# Patient Record
Sex: Female | Born: 1965
Health system: Southern US, Community
[De-identification: ages and names within clinical notes are randomized; demographics above are authoritative.]

## PROBLEM LIST (undated history)

## (undated) DIAGNOSIS — F988 Other specified behavioral and emotional disorders with onset usually occurring in childhood and adolescence: Secondary | ICD-10-CM

## (undated) DIAGNOSIS — B351 Tinea unguium: Secondary | ICD-10-CM

## (undated) DIAGNOSIS — F32A Depression, unspecified: Secondary | ICD-10-CM

## (undated) DIAGNOSIS — K219 Gastro-esophageal reflux disease without esophagitis: Secondary | ICD-10-CM

## (undated) DIAGNOSIS — R109 Unspecified abdominal pain: Secondary | ICD-10-CM

## (undated) DIAGNOSIS — E781 Pure hyperglyceridemia: Secondary | ICD-10-CM

## (undated) DIAGNOSIS — K5090 Crohn's disease, unspecified, without complications: Secondary | ICD-10-CM

## (undated) DIAGNOSIS — R002 Palpitations: Secondary | ICD-10-CM

## (undated) DIAGNOSIS — F329 Major depressive disorder, single episode, unspecified: Secondary | ICD-10-CM

## (undated) DIAGNOSIS — Z8041 Family history of malignant neoplasm of ovary: Secondary | ICD-10-CM

## (undated) DIAGNOSIS — K8020 Calculus of gallbladder without cholecystitis without obstruction: Secondary | ICD-10-CM

## (undated) DIAGNOSIS — R5383 Other fatigue: Secondary | ICD-10-CM

## (undated) HISTORY — PX: COLONOSCOPY: SHX174

## (undated) HISTORY — DX: Calculus of gallbladder without cholecystitis without obstruction: K80.20

## (undated) HISTORY — PX: COLON RESECTION: SHX5231

## (undated) HISTORY — DX: Palpitations: R00.2

## (undated) HISTORY — DX: Family history of malignant neoplasm of ovary: Z80.41

## (undated) HISTORY — DX: Other fatigue: R53.83

## (undated) HISTORY — DX: Tinea unguium: B35.1

## (undated) HISTORY — DX: Major depressive disorder, single episode, unspecified: F32.9

## (undated) HISTORY — DX: Unspecified abdominal pain: R10.9

## (undated) HISTORY — DX: Pure hyperglyceridemia: E78.1

## (undated) HISTORY — DX: Depression, unspecified: F32.A

## (undated) HISTORY — DX: Gastro-esophageal reflux disease without esophagitis: K21.9

## (undated) HISTORY — DX: Other specified behavioral and emotional disorders with onset usually occurring in childhood and adolescence: F98.8

---
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
## 1995-07-12 HISTORY — PX: ESOPHAGOGASTRODUODENOSCOPY: SHX1529

## 1997-11-10 ENCOUNTER — Inpatient Hospital Stay (HOSPITAL_COMMUNITY): Admission: EM | Admit: 1997-11-10 | Discharge: 1997-11-12 | Payer: Self-pay | Admitting: Emergency Medicine

## 1997-11-11 ENCOUNTER — Encounter: Payer: Self-pay | Admitting: Gastroenterology

## 1998-02-10 HISTORY — PX: PARTIAL COLECTOMY: SHX5273

## 1998-06-07 ENCOUNTER — Encounter: Payer: Self-pay | Admitting: Gastroenterology

## 1998-06-07 ENCOUNTER — Ambulatory Visit (HOSPITAL_COMMUNITY): Admission: RE | Admit: 1998-06-07 | Discharge: 1998-06-07 | Payer: Self-pay | Admitting: Gastroenterology

## 1998-07-02 ENCOUNTER — Inpatient Hospital Stay (HOSPITAL_COMMUNITY): Admission: RE | Admit: 1998-07-02 | Discharge: 1998-07-09 | Payer: Self-pay | Admitting: Surgery

## 1998-11-20 ENCOUNTER — Encounter (INDEPENDENT_AMBULATORY_CARE_PROVIDER_SITE_OTHER): Payer: Self-pay | Admitting: *Deleted

## 1998-11-20 ENCOUNTER — Other Ambulatory Visit: Admission: RE | Admit: 1998-11-20 | Discharge: 1998-11-20 | Payer: Self-pay | Admitting: Obstetrics and Gynecology

## 1999-11-26 ENCOUNTER — Inpatient Hospital Stay (HOSPITAL_COMMUNITY): Admission: AD | Admit: 1999-11-26 | Discharge: 1999-11-29 | Payer: Self-pay | Admitting: Obstetrics and Gynecology

## 2001-01-29 ENCOUNTER — Other Ambulatory Visit: Admission: RE | Admit: 2001-01-29 | Discharge: 2001-01-29 | Payer: Self-pay | Admitting: Obstetrics and Gynecology

## 2001-11-21 ENCOUNTER — Inpatient Hospital Stay (HOSPITAL_COMMUNITY): Admission: EM | Admit: 2001-11-21 | Discharge: 2001-11-25 | Payer: Self-pay | Admitting: Emergency Medicine

## 2001-11-21 ENCOUNTER — Encounter: Payer: Self-pay | Admitting: Gastroenterology

## 2001-11-22 ENCOUNTER — Encounter: Payer: Self-pay | Admitting: Gastroenterology

## 2002-05-17 ENCOUNTER — Emergency Department (HOSPITAL_COMMUNITY): Admission: EM | Admit: 2002-05-17 | Discharge: 2002-05-17 | Payer: Self-pay

## 2002-05-19 ENCOUNTER — Encounter: Payer: Self-pay | Admitting: Gastroenterology

## 2002-05-19 ENCOUNTER — Encounter: Admission: RE | Admit: 2002-05-19 | Discharge: 2002-05-19 | Payer: Self-pay | Admitting: Gastroenterology

## 2002-08-08 ENCOUNTER — Other Ambulatory Visit: Admission: RE | Admit: 2002-08-08 | Discharge: 2002-08-08 | Payer: Self-pay | Admitting: Obstetrics and Gynecology

## 2003-02-13 ENCOUNTER — Inpatient Hospital Stay (HOSPITAL_COMMUNITY): Admission: AD | Admit: 2003-02-13 | Discharge: 2003-02-16 | Payer: Self-pay | Admitting: Obstetrics and Gynecology

## 2003-03-21 ENCOUNTER — Other Ambulatory Visit: Admission: RE | Admit: 2003-03-21 | Discharge: 2003-03-21 | Payer: Self-pay | Admitting: Obstetrics and Gynecology

## 2004-09-16 ENCOUNTER — Other Ambulatory Visit: Admission: RE | Admit: 2004-09-16 | Discharge: 2004-09-16 | Payer: Self-pay | Admitting: Obstetrics and Gynecology

## 2009-04-24 ENCOUNTER — Emergency Department (HOSPITAL_COMMUNITY): Admission: EM | Admit: 2009-04-24 | Discharge: 2009-04-24 | Payer: Self-pay | Admitting: Emergency Medicine

## 2010-05-05 LAB — LIPASE, BLOOD: Lipase: 25 U/L (ref 11–59)

## 2010-05-05 LAB — CBC
HCT: 39.8 % (ref 36.0–46.0)
Hemoglobin: 13.5 g/dL (ref 12.0–15.0)
MCHC: 33.8 g/dL (ref 30.0–36.0)
MCV: 85.2 fL (ref 78.0–100.0)
RBC: 4.67 MIL/uL (ref 3.87–5.11)
WBC: 12.8 10*3/uL — ABNORMAL HIGH (ref 4.0–10.5)

## 2010-05-05 LAB — POCT PREGNANCY, URINE: Preg Test, Ur: NEGATIVE

## 2010-05-05 LAB — URINALYSIS, ROUTINE W REFLEX MICROSCOPIC
Bilirubin Urine: NEGATIVE
Glucose, UA: NEGATIVE mg/dL
Hgb urine dipstick: NEGATIVE
Ketones, ur: 40 mg/dL — AB
Nitrite: NEGATIVE
Protein, ur: NEGATIVE mg/dL
Specific Gravity, Urine: 1.036 — ABNORMAL HIGH (ref 1.005–1.030)
Urobilinogen, UA: 0.2 mg/dL (ref 0.0–1.0)
pH: 6 (ref 5.0–8.0)

## 2010-05-05 LAB — COMPREHENSIVE METABOLIC PANEL
BUN: 11 mg/dL (ref 6–23)
CO2: 24 mEq/L (ref 19–32)
Calcium: 8.7 mg/dL (ref 8.4–10.5)
Chloride: 107 mEq/L (ref 96–112)
Creatinine, Ser: 0.77 mg/dL (ref 0.4–1.2)
GFR calc non Af Amer: >60 mL/min (ref >60)
Glucose, Bld: 140 mg/dL — ABNORMAL HIGH (ref 70–99)
Total Bilirubin: 0.4 mg/dL (ref 0.3–1.2)

## 2010-05-05 LAB — DIFFERENTIAL
Basophils Absolute: 0 10*3/uL (ref 0.0–0.1)
Lymphocytes Relative: 8 % — ABNORMAL LOW (ref 12–46)
Lymphs Abs: 1 10*3/uL (ref 0.7–4.0)
Neutrophils Relative %: 88 % — ABNORMAL HIGH (ref 43–77)

## 2010-06-28 NOTE — H&P (Signed)
NAME:  Shelley Sutton, Shelley Sutton                    ACCOUNT NO.:  [id number]   MEDICAL RECORD NO.:  [id number]                   PATIENT TYPE:   LOCATION:                                       FACILITY:   PHYSICIAN:  Duke Salvia. Marcelle Overlie, M.D.            DATE OF BIRTH:   DATE OF ADMISSION:  02/13/2003  DATE OF DISCHARGE:                                HISTORY & PHYSICAL   CHIEF COMPLAINT:  For 2-stage labor induction at term.   HISTORY OF PRESENT ILLNESS:  A 45 year old G6, P3, 0-2-3 at term with a  prior history of macrosomia who presents for 2-stage labor induction. Blood  type A positive, rubella titer is positive.   PAST MEDICAL HISTORY:  History of Crohn's disease for which she underwent  a  small bowel resection in the year 2000.   OBSTETRIC HISTORY:  She delivered in 1990, 1995 and a 9 pound, 0 ounce female  in 2001 vaginally.   ALLERGIES:  None.   PAST MEDICAL HISTORY:  1. Appendectomy.  2. Small bowel resection in 2000.  3. Crohn's disease, her GI doctor is Dr. Matthias Hughs.  4. History of HSV. Her last outbreak was 4 to 5 years ago. She has been on     prophylactic Valtrex daily since 35 weeks.   PHYSICAL EXAMINATION:  VITAL SIGNS:  Temperature  92, blood pressure 110/70.  HEENT:  Unremarkable.  NECK:  Supple without masses.  LUNGS:  Clear.  CARDIOVASCULAR:  Regular rate and rhythm without murmur, rub or gallop.  BREASTS:  Not examined.  ABDOMEN:  Term fundal height. Fetal heart rate 140.  PELVIC:  Cervix was 50% vertex, intact membranes. No evidence of HSV or  other lesions.  EXTREMITIES:  Neurologic examination  unremarkable.   IMPRESSION:  1. Term intrauterine pregnancy.  2. History of herpes simplex virus. No evidence of recurrent disease. She     has been on Valtrex by mouth.  3. History of macrosomia.  4. History of Crohn's disease, stable.  5. Positive group B Strep culture.   PLAN:  2-stage labor induction, IV antibiotics when this patient is in  active  labor.                                               Richard M. Marcelle Overlie, M.D.    RMH/MEDQ  D:  02/11/2003  T:  02/11/2003  Job:  [id number]

## 2010-06-28 NOTE — Discharge Summary (Signed)
NAME:  Shelley Sutton, Shelley Sutton                          ACCOUNT NO.:  [id number]   MEDICAL RECORD NO.:  [id number]                   PATIENT TYPE:  INP   LOCATION:  3027                                 FACILITY:  [facility]   PHYSICIAN:  [name], MD                  DATE OF BIRTH:  1965-04-28   DATE OF ADMISSION:  11/21/2001  DATE OF DISCHARGE:  11/25/2001                                 DISCHARGE SUMMARY   FINAL DIAGNOSES:  1. Crohn's ileitis.  2. Status post previous ileal resection.  3. Abdominal pain, nausea and vomiting, probably related to transient bowel     obstruction, resolved.   CONSULTATIONS:  None.   COMPLICATIONS:  None.   PROCEDURE:  CT scan of abdomen and pelvis.   LABORATORY DATA:  Urine culture negative.  Admission white count 12,600,  hemoglobin 13.6, platelets 272,000; 83 polys, 13 lymphs.  INR normal.  Admission metabolic panel normal except for glucose of 130 (nonfasting).  Albumin 3.7.  Liver chemistries normal.  Urinalysis positive for ketones.  Amylase and lipase normal.   HOSPITAL COURSE:  The patient was admitted to the hospital following a  roughly 24-hour prodrome of severe, rather diffuse mid and lower abdominal  pain and protracted nausea and vomiting, ultimately resulting in just dry  heaves.  She presented to the emergency room, where she was admitted by my  covering partner, Dr. Tasia Catchings, with the presumptive diagnosis of an  acute small-bowel obstruction, perhaps from adhesions.  Her admitting  abdominal films showed some mild distention compatible with an early small-  bowel obstruction or ileus.  Due to the rather acute onset of symptoms, it  was felt most likely that the patient's clinical presentation was not  related to a flare-up of her Crohn's disease, but rather due to some  transient mechanical disturbance.  Note that the patient has a history of  Crohn's ileitis, status post a resection several years ago, but has been  maintained  off medication for the past couple of years and had been doing  fine.   The patient was admitted to the floor and treated with bowel rest, IV fluids  and IV pain medications and antiemetics and had a prompt turnaround in  symptoms.  A CT scan the day after admission showed no evidence of  persistent bowel obstruction, but a fair amount of ileal inflammation with  peri-intestinal stranding, bowel wall thickening and so forth.  There was no  evidence of obstruction, perforation or abscess.   The patient was treated with intravenous steroids, based on the above  findings.  Antibiotics were not administered.  Her diet was advanced and  this was well-tolerated, such that by the day of discharge, the patient was  tolerating a solid, low-residue diet without abdominal pain, nausea or  vomiting.  She was having a slight sleep disturbance, possibly related to  the prednisone, as  she had previously experienced as an outpatient some  years ago when she was on prednisone at that time.   DISPOSITION:  The patient is discharged home on a low-residue diet with  instructions to resume normal light activity as tolerated and to call us in  the event of worsened symptoms.  Cautioned regarding possible steroid side-  effects including sleep disturbance, reduced resistance to infection,  aseptic necrosis of the hip and long-term issues which hopefully will not be  relevant here, such as osteoporosis and cataracts.  She will follow up with  me in the office in one week, at which time we will probably set up a  colonoscopy and use that to guide the decision whether or not the patient  should be on maintenance therapy, for example, with 6-MP, which she used  previously and was well-tolerated.   DISCHARGE MEDICATIONS:  1. Prednisone 40 mg daily.  2. May also use a daily multivitamin if desired.   CONDITION ON DISCHARGE:  Improved.                                                [name], MD     RB/MEDQ  D:  11/25/2001  T:  11/27/2001  Job:  [id number]   cc:   Meredith Staggers, M.D.

## 2010-06-28 NOTE — H&P (Signed)
NAME:  Shelley, Sutton NO.:  [id number]   MEDICAL RECORD NO.:  [id number]                   PATIENT TYPE:  EMS   LOCATION:  MAJO                                 FACILITY:  [facility]   PHYSICIAN:  Tasia Catchings, M.D.                DATE OF BIRTH:  15-Aug-1965   DATE OF ADMISSION:  11/21/2001  DATE OF DISCHARGE:                                HISTORY & PHYSICAL   HISTORY OF PRESENT ILLNESS:  Shelley Sutton is a very nice 45 year old female  with a history of Crohn's disease. She has had resection of her terminal  ileum in 2000 and has been essentially asymptomatic ever since. She was  apparently suffering from intermittent abdominal pain, nausea, and vomiting  prior to her resection. Postoperatively, she has been on no medications.   About six weeks ago, she had a spontaneous abortion. She was about [redacted] weeks  pregnant. Since then, she has had the onset of her menstrual period  regularly starting about three days prior to admission. Twelve hours prior  to admission, she had some mild periumbilical crampy pain, but on the  morning of admission felt fine. She had a normal breakfast. Starting at 11  a.m., she began experiencing increasing crampy pain in the periumbilical  area associated with nausea and vomiting. This has persisted for the past  six hours. Normally, she has between four and six stools a day, and since  the onset of this pain, she has had no bowel movements at all.   There has been no extra intestinal manifestations of inflammatory bowel  disease. She has not had chills, fever, mouth sores, joint pains, skin  rashes, or any other symptoms.   PAST MEDICAL HISTORY:   ALLERGIES:  None.   HABITS:  Smoking none. Alcohol none.   CURRENT MEDICATIONS:  None.   PAST SURGICAL HISTORY:  Tonsillectomy and small-bowel resection.   PREVIOUS INJURIES:  None.   FAMILY HISTORY:  Father health and whereabouts unclear. Mother alive age 53  in good health.  No full siblings. Three children alive and well.   SOCIAL HISTORY:  [place] native, lives in [place] since age 65, is an  Magazine features editor, married twice-the last time since 1999.   PHYSICAL EXAMINATION:  GENERAL:  Pleasant, well-nourished female.  VITAL SIGNS:  Normal  NECK:  Supple.  HEENT:  Negative.  HEART:  Tones normal without extra sounds or murmurs.  CHEST:  Clear to auscultation and percussion.  ABDOMEN:  Slightly obese, minimally tender in the periumbilical area without  masses or organomegaly or bruits.  EXTREMITIES:  Showed no clubbing or edema.  PELVIC/RECTAL EXAM:  Deferred.   LABORATORY DATA:  At the time of this dictation, I did not have her labs or  her x-rays.   IMPRESSION:  1. Partial small-bowel obstruction.  2. History of Crohn's disease status post 8-cm terminal ileal resection in  2003Tasia Catchings, M.D.    JW/MEDQ  D:  11/21/2001  T:  11/23/2001  Job:  [id number]   cc:   Meredith Staggers, M.D.   [name], MD  [address]  [place], [place] [address]  Fax: [phone]

## 2010-09-30 ENCOUNTER — Ambulatory Visit
Admission: RE | Admit: 2010-09-30 | Discharge: 2010-09-30 | Disposition: A | Discharge status: Home / Self Care | Payer: BC Managed Care – PPO | Source: Ambulatory Visit | Attending: Family Medicine | Admitting: Family Medicine

## 2010-09-30 ENCOUNTER — Other Ambulatory Visit: Payer: Self-pay | Admitting: Family Medicine

## 2010-09-30 DIAGNOSIS — R05 Cough: Secondary | ICD-10-CM

## 2010-09-30 DIAGNOSIS — R059 Cough, unspecified: Secondary | ICD-10-CM

## 2012-06-04 ENCOUNTER — Emergency Department (HOSPITAL_COMMUNITY): Payer: BC Managed Care – PPO

## 2012-06-04 ENCOUNTER — Emergency Department (HOSPITAL_COMMUNITY)
Admission: EM | Admit: 2012-06-04 | Discharge: 2012-06-04 | Disposition: A | Discharge status: Home / Self Care | Payer: BC Managed Care – PPO | Attending: Emergency Medicine | Admitting: Emergency Medicine

## 2012-06-04 ENCOUNTER — Encounter (HOSPITAL_COMMUNITY): Payer: Self-pay | Admitting: *Deleted

## 2012-06-04 DIAGNOSIS — R1033 Periumbilical pain: Secondary | ICD-10-CM | POA: Insufficient documentation

## 2012-06-04 DIAGNOSIS — Z79899 Other long term (current) drug therapy: Secondary | ICD-10-CM | POA: Insufficient documentation

## 2012-06-04 DIAGNOSIS — R079 Chest pain, unspecified: Secondary | ICD-10-CM | POA: Insufficient documentation

## 2012-06-04 DIAGNOSIS — R109 Unspecified abdominal pain: Secondary | ICD-10-CM

## 2012-06-04 DIAGNOSIS — R112 Nausea with vomiting, unspecified: Secondary | ICD-10-CM | POA: Insufficient documentation

## 2012-06-04 DIAGNOSIS — K5900 Constipation, unspecified: Secondary | ICD-10-CM | POA: Insufficient documentation

## 2012-06-04 DIAGNOSIS — K8020 Calculus of gallbladder without cholecystitis without obstruction: Secondary | ICD-10-CM

## 2012-06-04 DIAGNOSIS — K5090 Crohn's disease, unspecified, without complications: Secondary | ICD-10-CM

## 2012-06-04 HISTORY — DX: Crohn's disease, unspecified, without complications: K50.90

## 2012-06-04 LAB — COMPREHENSIVE METABOLIC PANEL
ALT: 15 U/L (ref 0–35)
Albumin: 3.8 g/dL (ref 3.5–5.2)
Alkaline Phosphatase: 85 U/L (ref 39–117)
BUN: 11 mg/dL (ref 6–23)
Chloride: 97 mEq/L (ref 96–112)
Potassium: 4.2 mEq/L (ref 3.5–5.1)
Sodium: 134 mEq/L — ABNORMAL LOW (ref 135–145)
Total Bilirubin: 0.4 mg/dL (ref 0.3–1.2)
Total Protein: 7.7 g/dL (ref 6.0–8.3)

## 2012-06-04 LAB — CBC WITH DIFFERENTIAL/PLATELET
Basophils Relative: 0 % (ref 0–1)
Eosinophils Absolute: 0.1 10*3/uL (ref 0.0–0.7)
Hemoglobin: 14.1 g/dL (ref 12.0–15.0)
Lymphs Abs: 1.6 10*3/uL (ref 0.7–4.0)
MCH: 27.3 pg (ref 26.0–34.0)
MCHC: 33.7 g/dL (ref 30.0–36.0)
Monocytes Relative: 6 % (ref 3–12)
Neutro Abs: 10 10*3/uL — ABNORMAL HIGH (ref 1.7–7.7)
Neutrophils Relative %: 81 % — ABNORMAL HIGH (ref 43–77)
Platelets: 275 10*3/uL (ref 150–400)
RBC: 5.17 MIL/uL — ABNORMAL HIGH (ref 3.87–5.11)

## 2012-06-04 LAB — URINALYSIS, ROUTINE W REFLEX MICROSCOPIC
Glucose, UA: NEGATIVE mg/dL
Hgb urine dipstick: NEGATIVE
Ketones, ur: 15 mg/dL — AB
Specific Gravity, Urine: 1.038 — ABNORMAL HIGH (ref 1.005–1.030)
pH: 5 (ref 5.0–8.0)

## 2012-06-04 MED ORDER — PREDNISONE 20 MG PO TABS
40.0000 mg | ORAL_TABLET | Freq: Once | ORAL | Status: AC
  Administered 2012-06-04: 40 mg via ORAL
  Filled 2012-06-04: qty 2

## 2012-06-04 MED ORDER — IOHEXOL 300 MG/ML  SOLN
50.0000 mL | Freq: Once | INTRAMUSCULAR | Status: AC | PRN
  Administered 2012-06-04: 50 mL via ORAL

## 2012-06-04 MED ORDER — IOHEXOL 300 MG/ML  SOLN
100.0000 mL | Freq: Once | INTRAMUSCULAR | Status: AC | PRN
  Administered 2012-06-04: 100 mL via INTRAVENOUS

## 2012-06-04 MED ORDER — PREDNISONE 20 MG PO TABS: 40.0000 mg | ORAL_TABLET | Freq: Every day | ORAL | Status: DC

## 2012-06-04 NOTE — ED Notes (Signed)
Pt c/o upper abd pain since 6 am; got better; worse since 2 am; nausea/vomiting

## 2012-06-04 NOTE — ED Notes (Signed)
Pt returned from CT °

## 2012-06-04 NOTE — ED Provider Notes (Signed)
History     CSN: [id number]  Arrival date & time 06/04/12  4098   First MD Initiated Contact with Patient 06/04/12 [phone]      Chief Complaint  Patient presents with  . Abdominal Pain    (Consider location/radiation/quality/duration/timing/severity/associated sxs/prior treatment) Patient is a 47 y.o. female presenting with abdominal pain. The history is provided by the patient. No language interpreter was used.  Abdominal Pain Associated symptoms: chest pain ( mild, yesterday), constipation, nausea and vomiting   Associated symptoms: no chills, no cough, no diarrhea and no fever   Pt is a 47yo female PMH crohns c/o abdominal pain that has occurred 8 other times in the past since surgery in 2000.  States this episode started about 6am yesterday, was 10/10 sharp cramping (contraction like, per patient) pain in periumbilical region that radiated to epigastrium.  Pain eased off until 2am after taking Xanax from mother.  Pt states she just slept through the pain.  Pt has also had some nausea and vomiting with the pain.  Pt had hydrocodone around 2am which did no work initially, but once arrived in ED, pain was gone.  Denies fever or diarrhea.  States she only had a small BM earlier, normally she is very regular.   GI specialist:  Dr. Matthias Hughs   Past Medical History  Diagnosis Date  . Crohn's disease     Past Surgical History  Procedure Laterality Date  . Colon resection      No family history on file.  History  Substance Use Topics  . Smoking status: Never Smoker   . Smokeless tobacco: Not on file  . Alcohol Use: No    OB History   Grav Para Term Preterm Abortions TAB SAB Ect Mult Living                  Review of Systems  Constitutional: Negative for fever and chills.  Respiratory: Negative for cough.   Cardiovascular: Positive for chest pain ( mild, yesterday).  Gastrointestinal: Positive for nausea, vomiting, abdominal pain and constipation. Negative for diarrhea.     Allergies  Review of patient's allergies indicates no known allergies.  Home Medications   Current Outpatient Rx  Name  Route  Sig  Dispense  Refill  . lisdexamfetamine (VYVANSE) 40 MG capsule   Oral   Take 40 mg by mouth daily.         . predniSONE (DELTASONE) 20 MG tablet   Oral   Take 2 tablets (40 mg total) by mouth daily.   10 tablet   0     BP 105/67  Pulse 83  Temp(Src) 97.7 F (36.5 C)  Resp 16  Ht 5' 1.5" (1.562 m)  Wt 150 lb (68.04 kg)  BMI 27.89 kg/m2  SpO2 98%  LMP 05/08/2012  Physical Exam  Constitutional: She appears well-developed and well-nourished. No distress.  Pt resting comfortably on exam bed.  NAD  HENT:  Head: Normocephalic and atraumatic.  Eyes: Conjunctivae are normal. No scleral icterus.  Neck: Normal range of motion. Neck supple.  Cardiovascular: Normal rate, regular rhythm and normal heart sounds.   Pulmonary/Chest: Effort normal and breath sounds normal. No respiratory distress. She has no wheezes.  Abdominal: Soft. Bowel sounds are normal. She exhibits no distension and no mass. There is tenderness ( umbilical and epigastric region). There is no rebound and no guarding.  Musculoskeletal: Normal range of motion.  Neurological: She is alert.  Skin: Skin is warm and dry. She is  not diaphoretic.    ED Course  Procedures (including critical care time)  Labs Reviewed  CBC WITH DIFFERENTIAL - Abnormal; Notable for the following:    WBC 12.4 (*)    RBC 5.17 (*)    Neutrophils Relative 81 (*)    Neutro Abs 10.0 (*)    All other components within normal limits  COMPREHENSIVE METABOLIC PANEL - Abnormal; Notable for the following:    Sodium 134 (*)    Glucose, Bld 137 (*)    All other components within normal limits  URINALYSIS, ROUTINE W REFLEX MICROSCOPIC - Abnormal; Notable for the following:    Color, Urine AMBER (*)    APPearance CLOUDY (*)    Specific Gravity, Urine 1.038 (*)    Bilirubin Urine SMALL (*)    Ketones, ur 15  (*)    All other components within normal limits  LIPASE, BLOOD   Ct Abdomen Pelvis W Contrast  06/04/2012  *RADIOLOGY REPORT*  Clinical Data: Abdominal pain for 2 days.  Nausea.  History of Crohn's disease.  CT ABDOMEN AND PELVIS WITH CONTRAST  Technique:  Multidetector CT imaging of the abdomen and pelvis was performed following the standard protocol during bolus administration of intravenous contrast.  Contrast: 50mL OMNIPAQUE IOHEXOL 300 MG/ML  SOLN, OMNIPAQUE IOHEXOL 300 MG/ML  SOLN  Comparison: 06/04/2012.  Findings: Lung Bases: Clear.  Liver:  Tiny low density lesion in the right hepatic dome compatible with a cyst.  Otherwise normal.  No intrahepatic biliary ductal dilation.  Spleen:  Normal.  Gallbladder:  Calcified gallstone.  Distended gallbladder likely secondary to fasting state.  Common bile duct:  Normal.  Pancreas:  Normal.  Adrenal glands:  Normal.  Kidneys:  Normal enhancement and delayed excretion.  No calculi.  Stomach:  Distended with oral contrast.  No mural thickening or inflammatory changes.  Small bowel:  Nondistended duodenum.  Thickened loops of bowel are present in the upper abdomen.  These thickened jejunal loops suggest enteritis.  Thickening of the bowel wall is greater than expected for under distention.  There is no obstruction.  No free air.    Inflammatory changes are present in the ileum (image number 61 series 2) with mural thickening.  Prominent ileocolic lymph nodes are present. Partial ascending colectomy with ileocolic anastomoses.  The anastomosis appears patent.  Colon:   Fluid levels in the proximal colon likely associated with Crohn's disease. Distal colon appears within normal limits.  Rectum normal.  Pelvic Genitourinary:  Physiologic appearance of the uterus and adnexa.  Small to moderate amount of free fluid, greater than expected for physiologic.  This is probably reactive to enteritis.  Bones:  Ordinary degenerative changes of the sacroiliac joints.  No  erosive changes.  No destructive osseous lesions are identified.  Vasculature: Normal.  IMPRESSION:  1.  Recurrent Crohn's disease affecting the terminal ileum.  No perforation or abscess.  Reactive ileocolic lymph nodes.  Small amount of likely reactive ascites.  More mild inflammation in the proximal jejunum, also likely reflective of Crohn's disease. 2.  Partial ascending colectomy.  Liquid stool in the proximal colon associated with Crohn's disease. 3.  No bowel obstruction. 4.  Calcified gallstone.  No CT evidence of acute cholecystitis.   Original Report Authenticated By: Andreas Newport, M.D.    Dg Abd Acute W/chest  06/04/2012  *RADIOLOGY REPORT*  Clinical Data: Abdominal pain.  Mid abdominal pain with nausea and vomiting.  ACUTE ABDOMEN SERIES (ABDOMEN 2 VIEW & CHEST 1 VIEW)  Comparison:  09/30/2010.  Findings: There is no active cardiopulmonary disease.  No airspace disease.  No effusion.  No free air underneath the hemidiaphragms.  Scattered colonic and small bowel air fluid levels are present. Faint staple line is present in the right mid abdomen.  Calcified gallstone in the right upper quadrant.  There is no small bowel dilation.  Small amount of stool and bowel gas extends to the rectosigmoid.  The overall appearance is nonobstructive.  IMPRESSION: 1.  Scattered nonspecific small bowel and large bowel air fluid levels, most commonly associated with enteric infection. 2.  Cholelithiasis. 3.  Postoperative changes compatible with prior intestinal surgery in the right abdomen.   Original Report Authenticated By: Andreas Newport, M.D.     9:37 AM Pt declined pain medicine at this time.  Will obtain acute abd w/ chest and perform fluid challenge.   1. Abdominal pain   2. Crohn's disease   3. Gall stone       MDM  Pt hx of crohn's had abd pain off and on since surgery in 2000, worse off and on since yesterday associated with n/v/d.  10/10 at worst, about 0/10 right now. Denies fever.  GI  specialist is Dr. Matthias Hughs  UA: neg for infection. CBC: mild leukocytosis.  Acute Abd: scattered nonspecific small & large bowel air fluid levels, cholelithiasis.  CT Abd/pelvis: no acute changes.  Findings consistent with crohn's   Will discharge pt home. Start on 1wk of Prednisone.  Have pt f/u with Dr. Matthias Hughs, GI, in one week.   Vitals: unremarkable. Discharged in stable condition.    Discussed pt with attending during ED encounter.             Junius Finner, PA-C 06/04/12 1028

## 2012-06-04 NOTE — ED Notes (Signed)
Patient transported to CT 

## 2012-06-08 NOTE — ED Provider Notes (Signed)
Medical screening examination/treatment/procedure(s) were performed by non-physician practitioner and as supervising physician I was immediately available for consultation/collaboration.  Raeford Razor, MD 06/08/12 [phone]

## 2014-06-28 ENCOUNTER — Other Ambulatory Visit: Payer: Self-pay | Admitting: Obstetrics and Gynecology

## 2014-06-29 LAB — CYTOLOGY - PAP

## 2014-12-04 ENCOUNTER — Encounter: Payer: Self-pay | Admitting: Cardiovascular Disease

## 2014-12-08 ENCOUNTER — Ambulatory Visit: Payer: Self-pay | Admitting: Cardiovascular Disease

## 2014-12-25 ENCOUNTER — Encounter: Payer: Self-pay | Admitting: Internal Medicine

## 2014-12-25 ENCOUNTER — Ambulatory Visit (INDEPENDENT_AMBULATORY_CARE_PROVIDER_SITE_OTHER): Payer: BLUE CROSS/BLUE SHIELD | Admitting: Internal Medicine

## 2014-12-25 VITALS — BP 118/78 | HR 104 | Ht 61.5 in | Wt 172.8 lb

## 2014-12-25 DIAGNOSIS — R002 Palpitations: Secondary | ICD-10-CM

## 2014-12-25 NOTE — Progress Notes (Signed)
Cardiology Office Note   Date:  12/25/2014   ID:  Shelley Sutton, DOB 10-Mar-1965, MRN [id number]  PCP:  No primary care provider on file.  Cardiologist:   Dietrich Pates, MD   No chief complaint on file.     History of Present Illness: Shelley Sutton is a 49 y.o. female referred for palpitations   She says that she has occasional fluttering in chest  Short lived  No dizziness Not often No syncope Active  No CP Drinks 1 coffee , 1 tea per day Occasional pressure on forehead.  Random  BP up at little at times  130s/90-  Current Outpatient Prescriptions  Medication Sig Dispense Refill  . calcium-vitamin D (OSCAL WITH D) 500-200 MG-UNIT tablet Take 1 tablet by mouth daily with breakfast.    . folic acid (FOLVITE) 1 MG tablet Take 1 mg by mouth daily.    . Nutritional Supplements (JUICE PLUS FIBRE PO) Take 1 tablet by mouth daily.     No current facility-administered medications for this visit.    Allergies:   Dicyclomine hcl   Past Medical History  Diagnosis Date  . Crohn's disease (HCC)   . Esophageal reflux   . Depression   . Onychomycosis   . ADD (attention deficit disorder)   . Hypertriglyceridemia   . Gallstones   . Abdominal pain   . FH: ovarian cancer   . Palpitations   . Fluttering sensation of heart   . Fatigue   . Depression   . Onychomycosis   . Gallstones     Past Surgical History  Procedure Laterality Date  . Colon resection    . Tonsillectomy  1979  . Partial colectomy  2000  . Colonoscopy  09/1995, 06/1998,04/2002  . Esophagogastroduodenoscopy  07/1995     Social History:  The patient  reports that she quit smoking about 18 years ago. She does not have any smokeless tobacco history on file. She reports that she drinks alcohol. She reports that she does not use illicit drugs.   Family History:  The patient's family history includes Brain cancer in her paternal grandfather; Cancer in her paternal grandmother; Heart failure in her maternal  grandfather; Ovarian cancer in her maternal grandmother.    ROS:  Please see the history of present illness. All other systems are reviewed and  Negative to the above problem except as noted.    PHYSICAL EXAM: VS:  BP 118/78 mmHg  Pulse 104  Ht 5' 1.5" (1.562 m)  Wt 78.382 kg (172 lb 12.8 oz)  BMI 32.13 kg/m2  SpO2 97%  GEN: Well nourished, well developed, in no acute distress HEENT: normal Neck: no JVD, carotid bruits, or masses Cardiac: RRR; no murmurs, rubs, or gallops,no edema  Respiratory:  clear to auscultation bilaterally, normal work of breathing GI: soft, nontender, nondistended, + BS  No hepatomegaly  MS: no deformity Moving all extremities   Skin: warm and dry, no rash Neuro:  Strength and sensation are intact Psych: euthymic mood, full affect   EKG:  EKG is not ordered today.  On 8/22 SR 84 bpm    Lipid Panel No results found for: CHOL, TRIG, HDL, CHOLHDL, VLDL, LDLCALC, LDLDIRECT    Wt Readings from Last 3 Encounters:  12/25/14 78.382 kg (172 lb 12.8 oz)  06/04/12 68.04 kg (150 lb)      ASSESSMENT AND PLAN:  1  Palpitations   Occasional  Not hemodynamically destabilizing   Encouraged her to stay hydrated, stay  acitve   HR when I examined her was 84.   I am not convinced of any arrhythmia  2.  HCM  Lipids. Are good  F?U PRN   Signed, Dietrich Pates, MD  12/25/2014 11:11 AM    [facility] [address] [place], [place], [place]  [address] Phone: [phone]; Fax: [phone]

## 2014-12-25 NOTE — Patient Instructions (Signed)
Your physician recommends that you continue on your current medications as directed. Please refer to the Current Medication list given to you today. Your physician recommends that you schedule a follow-up appointment as needed with Dr. Ross.   

## 2015-08-21 DIAGNOSIS — Z1231 Encounter for screening mammogram for malignant neoplasm of breast: Secondary | ICD-10-CM | POA: Diagnosis not present

## 2015-08-21 DIAGNOSIS — Z6831 Body mass index (BMI) 31.0-31.9, adult: Secondary | ICD-10-CM | POA: Diagnosis not present

## 2015-08-21 DIAGNOSIS — Z01419 Encounter for gynecological examination (general) (routine) without abnormal findings: Secondary | ICD-10-CM | POA: Diagnosis not present

## 2015-08-30 DIAGNOSIS — Z131 Encounter for screening for diabetes mellitus: Secondary | ICD-10-CM | POA: Diagnosis not present

## 2015-08-30 DIAGNOSIS — N912 Amenorrhea, unspecified: Secondary | ICD-10-CM | POA: Diagnosis not present

## 2015-08-30 DIAGNOSIS — Z1329 Encounter for screening for other suspected endocrine disorder: Secondary | ICD-10-CM | POA: Diagnosis not present

## 2015-08-30 DIAGNOSIS — Z13220 Encounter for screening for lipoid disorders: Secondary | ICD-10-CM | POA: Diagnosis not present

## 2016-08-25 DIAGNOSIS — Z01419 Encounter for gynecological examination (general) (routine) without abnormal findings: Secondary | ICD-10-CM | POA: Diagnosis not present

## 2016-08-25 DIAGNOSIS — Z1231 Encounter for screening mammogram for malignant neoplasm of breast: Secondary | ICD-10-CM | POA: Diagnosis not present

## 2016-08-25 DIAGNOSIS — Z6832 Body mass index (BMI) 32.0-32.9, adult: Secondary | ICD-10-CM | POA: Diagnosis not present

## 2016-12-02 ENCOUNTER — Other Ambulatory Visit: Payer: Self-pay | Admitting: Family Medicine

## 2016-12-02 ENCOUNTER — Ambulatory Visit
Admission: RE | Admit: 2016-12-02 | Discharge: 2016-12-02 | Disposition: A | Discharge status: Home / Self Care | Payer: BLUE CROSS/BLUE SHIELD | Source: Ambulatory Visit | Attending: Family Medicine | Admitting: Family Medicine

## 2016-12-02 DIAGNOSIS — R5383 Other fatigue: Secondary | ICD-10-CM | POA: Diagnosis not present

## 2016-12-02 DIAGNOSIS — J3489 Other specified disorders of nose and nasal sinuses: Secondary | ICD-10-CM

## 2016-12-02 DIAGNOSIS — B351 Tinea unguium: Secondary | ICD-10-CM | POA: Diagnosis not present

## 2016-12-02 DIAGNOSIS — R51 Headache: Secondary | ICD-10-CM | POA: Diagnosis not present

## 2017-01-09 DIAGNOSIS — K50012 Crohn's disease of small intestine with intestinal obstruction: Secondary | ICD-10-CM | POA: Diagnosis not present

## 2017-01-09 DIAGNOSIS — Z1211 Encounter for screening for malignant neoplasm of colon: Secondary | ICD-10-CM | POA: Diagnosis not present

## 2017-01-09 DIAGNOSIS — Z01818 Encounter for other preprocedural examination: Secondary | ICD-10-CM | POA: Diagnosis not present

## 2017-08-28 DIAGNOSIS — M545 Low back pain: Secondary | ICD-10-CM | POA: Diagnosis not present

## 2017-08-28 DIAGNOSIS — R1030 Lower abdominal pain, unspecified: Secondary | ICD-10-CM | POA: Diagnosis not present

## 2017-09-09 DIAGNOSIS — N8182 Incompetence or weakening of pubocervical tissue: Secondary | ICD-10-CM | POA: Diagnosis not present

## 2017-09-15 DIAGNOSIS — N939 Abnormal uterine and vaginal bleeding, unspecified: Secondary | ICD-10-CM | POA: Diagnosis not present

## 2017-09-15 DIAGNOSIS — N950 Postmenopausal bleeding: Secondary | ICD-10-CM | POA: Diagnosis not present

## 2017-10-13 DIAGNOSIS — Z6827 Body mass index (BMI) 27.0-27.9, adult: Secondary | ICD-10-CM | POA: Diagnosis not present

## 2017-10-13 DIAGNOSIS — Z1231 Encounter for screening mammogram for malignant neoplasm of breast: Secondary | ICD-10-CM | POA: Diagnosis not present

## 2017-10-13 DIAGNOSIS — Z01419 Encounter for gynecological examination (general) (routine) without abnormal findings: Secondary | ICD-10-CM | POA: Diagnosis not present

## 2017-10-13 DIAGNOSIS — Z8041 Family history of malignant neoplasm of ovary: Secondary | ICD-10-CM | POA: Diagnosis not present

## 2017-10-13 DIAGNOSIS — Z808 Family history of malignant neoplasm of other organs or systems: Secondary | ICD-10-CM | POA: Diagnosis not present

## 2017-10-16 DIAGNOSIS — K50012 Crohn's disease of small intestine with intestinal obstruction: Secondary | ICD-10-CM | POA: Diagnosis not present

## 2017-10-16 DIAGNOSIS — Z01818 Encounter for other preprocedural examination: Secondary | ICD-10-CM | POA: Diagnosis not present

## 2017-11-04 ENCOUNTER — Ambulatory Visit (INDEPENDENT_AMBULATORY_CARE_PROVIDER_SITE_OTHER): Payer: BLUE CROSS/BLUE SHIELD | Admitting: Podiatry

## 2017-11-04 ENCOUNTER — Encounter: Payer: Self-pay | Admitting: Podiatry

## 2017-11-04 DIAGNOSIS — Z79899 Other long term (current) drug therapy: Secondary | ICD-10-CM | POA: Diagnosis not present

## 2017-11-04 DIAGNOSIS — B351 Tinea unguium: Secondary | ICD-10-CM

## 2017-11-04 MED ORDER — TERBINAFINE HCL 250 MG PO TABS: 250.0000 mg | ORAL_TABLET | Freq: Every day | ORAL | 0 refills | Status: DC

## 2017-11-04 NOTE — Progress Notes (Signed)
   Subjective: 51 year old female presenting today as a new patient with a chief complaint of possible fungus of the right great toenail that has been present for the past several months. She states the nail is peeling, thickened and discolored. She has not done anything for treatment and denies modifying factors. Patient is here for further evaluation and treatment.   Past Medical History:  Diagnosis Date  . Abdominal pain   . ADD (attention deficit disorder)   . Crohn's disease (HCC)   . Depression   . Depression   . Esophageal reflux   . Fatigue   . FH: ovarian cancer   . Fluttering sensation of heart   . Gallstones   . Gallstones   . Hypertriglyceridemia   . Onychomycosis   . Onychomycosis   . Palpitations     Objective: Physical Exam General: The patient is alert and oriented x3 in no acute distress.  Dermatology: Hyperkeratotic, discolored, thickened, onychodystrophy of the right great toenail. Skin is warm, dry and supple bilateral lower extremities. Negative for open lesions or macerations.  Vascular: Palpable pedal pulses bilaterally. No edema or erythema noted. Capillary refill within normal limits.  Neurological: Epicritic and protective threshold grossly intact bilaterally.   Musculoskeletal Exam: Range of motion within normal limits to all pedal and ankle joints bilateral. Muscle strength 5/5 in all groups bilateral.   Assessment: #1 onychomycosis right great toenail #2 hyperkeratotic nail right hallux   Plan of Care:  #1 Patient was evaluated. #2 Prescription for Lamisil 250 mg #90 provided to patient.  #3 Orders for liver function test placed today.  #4 We will contact patient if LFT is abnormal.  #5 Return to clinic as needed.   Felecia Shelling, DPM [facility]  Dr. Felecia Shelling, DPM    [address]                                        [place], [place] [address]                Office [phone]  Fax [phone]

## 2017-11-20 DIAGNOSIS — Z980 Intestinal bypass and anastomosis status: Secondary | ICD-10-CM | POA: Diagnosis not present

## 2017-11-20 DIAGNOSIS — K5730 Diverticulosis of large intestine without perforation or abscess without bleeding: Secondary | ICD-10-CM | POA: Diagnosis not present

## 2017-11-20 DIAGNOSIS — Z1211 Encounter for screening for malignant neoplasm of colon: Secondary | ICD-10-CM | POA: Diagnosis not present

## 2017-12-22 ENCOUNTER — Other Ambulatory Visit: Payer: Self-pay | Admitting: Gastroenterology

## 2017-12-22 DIAGNOSIS — K50012 Crohn's disease of small intestine with intestinal obstruction: Secondary | ICD-10-CM

## 2017-12-30 ENCOUNTER — Ambulatory Visit
Admission: RE | Admit: 2017-12-30 | Discharge: 2017-12-30 | Disposition: A | Discharge status: Home / Self Care | Payer: BLUE CROSS/BLUE SHIELD | Source: Ambulatory Visit | Attending: Gastroenterology | Admitting: Gastroenterology

## 2017-12-30 DIAGNOSIS — K8020 Calculus of gallbladder without cholecystitis without obstruction: Secondary | ICD-10-CM | POA: Diagnosis not present

## 2017-12-30 DIAGNOSIS — K50012 Crohn's disease of small intestine with intestinal obstruction: Secondary | ICD-10-CM

## 2017-12-30 MED ORDER — IOPAMIDOL (ISOVUE-300) INJECTION 61%
100.0000 mL | Freq: Once | INTRAVENOUS | Status: AC | PRN
  Administered 2017-12-30: 100 mL via INTRAVENOUS

## 2018-01-20 DIAGNOSIS — K50012 Crohn's disease of small intestine with intestinal obstruction: Secondary | ICD-10-CM | POA: Diagnosis not present

## 2018-01-20 DIAGNOSIS — K8020 Calculus of gallbladder without cholecystitis without obstruction: Secondary | ICD-10-CM | POA: Diagnosis not present

## 2019-02-17 DIAGNOSIS — Z6833 Body mass index (BMI) 33.0-33.9, adult: Secondary | ICD-10-CM | POA: Diagnosis not present

## 2019-02-17 DIAGNOSIS — Z01419 Encounter for gynecological examination (general) (routine) without abnormal findings: Secondary | ICD-10-CM | POA: Diagnosis not present

## 2019-02-17 DIAGNOSIS — Z1231 Encounter for screening mammogram for malignant neoplasm of breast: Secondary | ICD-10-CM | POA: Diagnosis not present

## 2019-08-05 DIAGNOSIS — S61207A Unspecified open wound of left little finger without damage to nail, initial encounter: Secondary | ICD-10-CM | POA: Diagnosis not present

## 2019-09-22 DIAGNOSIS — J029 Acute pharyngitis, unspecified: Secondary | ICD-10-CM | POA: Diagnosis not present

## 2019-09-22 DIAGNOSIS — R5383 Other fatigue: Secondary | ICD-10-CM | POA: Diagnosis not present

## 2020-01-10 DIAGNOSIS — Z20822 Contact with and (suspected) exposure to covid-19: Secondary | ICD-10-CM | POA: Diagnosis not present

## 2020-01-10 DIAGNOSIS — U071 COVID-19: Secondary | ICD-10-CM | POA: Diagnosis not present

## 2020-07-08 ENCOUNTER — Emergency Department (HOSPITAL_COMMUNITY): Payer: BC Managed Care – PPO

## 2020-07-08 ENCOUNTER — Encounter (HOSPITAL_COMMUNITY): Payer: Self-pay

## 2020-07-08 ENCOUNTER — Other Ambulatory Visit: Payer: Self-pay

## 2020-07-08 ENCOUNTER — Observation Stay (HOSPITAL_COMMUNITY)
Admission: EM | Admit: 2020-07-08 | Discharge: 2020-07-09 | Disposition: A | Discharge status: Home / Self Care | Payer: BC Managed Care – PPO | Attending: Family Medicine | Admitting: Family Medicine

## 2020-07-08 DIAGNOSIS — R739 Hyperglycemia, unspecified: Secondary | ICD-10-CM | POA: Insufficient documentation

## 2020-07-08 DIAGNOSIS — K5000 Crohn's disease of small intestine without complications: Secondary | ICD-10-CM

## 2020-07-08 DIAGNOSIS — Z87891 Personal history of nicotine dependence: Secondary | ICD-10-CM | POA: Diagnosis not present

## 2020-07-08 DIAGNOSIS — Z20822 Contact with and (suspected) exposure to covid-19: Secondary | ICD-10-CM | POA: Insufficient documentation

## 2020-07-08 DIAGNOSIS — K50919 Crohn's disease, unspecified, with unspecified complications: Secondary | ICD-10-CM

## 2020-07-08 DIAGNOSIS — K7689 Other specified diseases of liver: Secondary | ICD-10-CM | POA: Diagnosis not present

## 2020-07-08 DIAGNOSIS — R109 Unspecified abdominal pain: Secondary | ICD-10-CM | POA: Diagnosis present

## 2020-07-08 DIAGNOSIS — R112 Nausea with vomiting, unspecified: Secondary | ICD-10-CM

## 2020-07-08 DIAGNOSIS — K5090 Crohn's disease, unspecified, without complications: Principal | ICD-10-CM | POA: Insufficient documentation

## 2020-07-08 DIAGNOSIS — R1084 Generalized abdominal pain: Secondary | ICD-10-CM

## 2020-07-08 DIAGNOSIS — K769 Liver disease, unspecified: Secondary | ICD-10-CM

## 2020-07-08 LAB — COMPREHENSIVE METABOLIC PANEL
ALT: 25 U/L (ref 0–44)
AST: 24 U/L (ref 15–41)
Albumin: 4.5 g/dL (ref 3.5–5.0)
Alkaline Phosphatase: 70 U/L (ref 38–126)
Anion gap: 10 (ref 5–15)
BUN: 17 mg/dL (ref 6–20)
CO2: 27 mmol/L (ref 22–32)
Calcium: 10.3 mg/dL (ref 8.9–10.3)
Chloride: 103 mmol/L (ref 98–111)
Creatinine, Ser: 0.79 mg/dL (ref 0.44–1.00)
GFR, Estimated: >60 mL/min (ref >60)
Glucose, Bld: 149 mg/dL — ABNORMAL HIGH (ref 70–99)
Potassium: 3.8 mmol/L (ref 3.5–5.1)
Sodium: 140 mmol/L (ref 135–145)
Total Bilirubin: 0.6 mg/dL (ref 0.3–1.2)
Total Protein: 7.9 g/dL (ref 6.5–8.1)

## 2020-07-08 LAB — CBC WITH DIFFERENTIAL/PLATELET
Abs Immature Granulocytes: 0.04 10*3/uL (ref 0.00–0.07)
Basophils Absolute: 0 10*3/uL (ref 0.0–0.1)
Basophils Relative: 0 %
Eosinophils Absolute: 0 10*3/uL (ref 0.0–0.5)
Eosinophils Relative: 0 %
HCT: 41.5 % (ref 36.0–46.0)
Hemoglobin: 13.9 g/dL (ref 12.0–15.0)
Immature Granulocytes: 0 %
Lymphocytes Relative: 14 %
Lymphs Abs: 1.6 10*3/uL (ref 0.7–4.0)
MCH: 28.7 pg (ref 26.0–34.0)
MCHC: 33.5 g/dL (ref 30.0–36.0)
MCV: 85.7 fL (ref 80.0–100.0)
Monocytes Absolute: 0.6 10*3/uL (ref 0.1–1.0)
Monocytes Relative: 5 %
Neutro Abs: 8.9 10*3/uL — ABNORMAL HIGH (ref 1.7–7.7)
Neutrophils Relative %: 81 %
Platelets: 243 10*3/uL (ref 150–400)
RBC: 4.84 MIL/uL (ref 3.87–5.11)
RDW: 13.1 % (ref 11.5–15.5)
WBC: 11.2 10*3/uL — ABNORMAL HIGH (ref 4.0–10.5)
nRBC: 0 % (ref 0.0–0.2)

## 2020-07-08 LAB — RESP PANEL BY RT-PCR (FLU A&B, COVID) ARPGX2
Influenza A by PCR: NEGATIVE
Influenza B by PCR: NEGATIVE
SARS Coronavirus 2 by RT PCR: NEGATIVE

## 2020-07-08 LAB — LIPASE, BLOOD: Lipase: 37 U/L (ref 11–51)

## 2020-07-08 LAB — HIV ANTIBODY (ROUTINE TESTING W REFLEX): HIV Screen 4th Generation wRfx: NONREACTIVE

## 2020-07-08 MED ORDER — ADULT MULTIVITAMIN W/MINERALS CH
1.0000 | ORAL_TABLET | Freq: Every day | ORAL | Status: DC
  Administered 2020-07-09: 1 via ORAL
  Filled 2020-07-08: qty 1

## 2020-07-08 MED ORDER — MORPHINE SULFATE (PF) 4 MG/ML IV SOLN
4.0000 mg | INTRAVENOUS | Status: DC | PRN
Start: 2020-07-08 — End: 2020-07-09

## 2020-07-08 MED ORDER — ONDANSETRON HCL 4 MG/2ML IJ SOLN
4.0000 mg | Freq: Once | INTRAMUSCULAR | Status: AC
  Administered 2020-07-08: 4 mg via INTRAVENOUS
  Filled 2020-07-08: qty 2

## 2020-07-08 MED ORDER — ACETAMINOPHEN 650 MG RE SUPP: 650.0000 mg | Freq: Four times a day (QID) | RECTAL | Status: DC | PRN

## 2020-07-08 MED ORDER — SODIUM CHLORIDE 0.9 % IV SOLN: Freq: Once | INTRAVENOUS | Status: AC

## 2020-07-08 MED ORDER — OXYMETAZOLINE HCL 0.05 % NA SOLN
1.0000 | Freq: Two times a day (BID) | NASAL | Status: DC | PRN
  Filled 2020-07-08: qty 15

## 2020-07-08 MED ORDER — PREDNISONE 20 MG PO TABS
40.0000 mg | ORAL_TABLET | Freq: Every day | ORAL | Status: DC
  Administered 2020-07-09: 40 mg via ORAL
  Filled 2020-07-08: qty 2

## 2020-07-08 MED ORDER — ACETAMINOPHEN 325 MG PO TABS
650.0000 mg | ORAL_TABLET | Freq: Four times a day (QID) | ORAL | Status: DC | PRN
  Administered 2020-07-09: 650 mg via ORAL
  Filled 2020-07-08: qty 2

## 2020-07-08 MED ORDER — HYDROCODONE-ACETAMINOPHEN 5-325 MG PO TABS: 1.0000 | ORAL_TABLET | ORAL | Status: DC | PRN

## 2020-07-08 MED ORDER — ONDANSETRON HCL 4 MG/2ML IJ SOLN: 4.0000 mg | Freq: Four times a day (QID) | INTRAMUSCULAR | Status: DC | PRN

## 2020-07-08 MED ORDER — PREDNISONE 20 MG PO TABS
40.0000 mg | ORAL_TABLET | Freq: Once | ORAL | Status: AC
  Administered 2020-07-08: 40 mg via ORAL
  Filled 2020-07-08: qty 2

## 2020-07-08 MED ORDER — SODIUM CHLORIDE 0.9 % IV BOLUS
1000.0000 mL | Freq: Once | INTRAVENOUS | Status: AC
  Administered 2020-07-08: 1000 mL via INTRAVENOUS

## 2020-07-08 MED ORDER — FENTANYL CITRATE (PF) 100 MCG/2ML IJ SOLN
100.0000 ug | Freq: Once | INTRAMUSCULAR | Status: AC
  Administered 2020-07-08: 100 ug via INTRAVENOUS
  Filled 2020-07-08: qty 2

## 2020-07-08 MED ORDER — ONDANSETRON HCL 4 MG PO TABS: 4.0000 mg | ORAL_TABLET | Freq: Four times a day (QID) | ORAL | Status: DC | PRN

## 2020-07-08 MED ORDER — OXYCODONE-ACETAMINOPHEN 5-325 MG PO TABS
1.0000 | ORAL_TABLET | Freq: Once | ORAL | Status: AC
  Administered 2020-07-08: 1 via ORAL
  Filled 2020-07-08: qty 1

## 2020-07-08 NOTE — ED Provider Notes (Signed)
[facility]-EMERGENCY DEPT Provider Note   CSN: [id number] Arrival date & time: 07/08/20  8280     History Chief Complaint  Patient presents with  . Abdominal Pain    Shelley Sutton is a 55 y.o. female presenting for evaluation of nausea, vomiting, abd pain.  Symptoms began around 8:00 last night, proximately 12 hours ago.  She had gradual onset upper abdominal pain, nausea, vomiting.  She has been unable to tolerate any p.o. since.  She reports severe constant pain diffusely throughout the abdomen.  She has a history of recurrent bowel obstructions after a partial colectomy due to Crohn's.  Last obstruction was several years ago.  Last bowel movement was last night, however she is unable to have a bowel movement since, this is abnormal for her.  She does not know she is passing gas.  She denies fevers, chest pain, shortness of breath, cough, urinary symptoms.  She is no longer taking anything for her Crohn's, is well controlled after the colectomy.  HPI     Past Medical History:  Diagnosis Date  . Abdominal pain   . ADD (attention deficit disorder)   . Crohn's disease (HCC)   . Depression   . Depression   . Esophageal reflux   . Fatigue   . FH: ovarian cancer   . Fluttering sensation of heart   . Gallstones   . Gallstones   . Hypertriglyceridemia   . Onychomycosis   . Onychomycosis   . Palpitations     Patient Active Problem List   Diagnosis Date Noted  . Crohn's disease (HCC) 07/08/2020    Past Surgical History:  Procedure Laterality Date  . COLON RESECTION    . COLONOSCOPY  09/1995, 06/1998,04/2002  . ESOPHAGOGASTRODUODENOSCOPY  07/1995  . PARTIAL COLECTOMY  2000  . TONSILLECTOMY  1979     OB History   No obstetric history on file.     Family History  Problem Relation Age of Onset  . Ovarian cancer Maternal Grandmother   . Heart failure Maternal Grandfather   . Cancer Paternal Grandmother   . Brain cancer Paternal Grandfather      Social History   Tobacco Use  . Smoking status: Former Smoker    Quit date: 12/03/1996    Years since quitting: 23.6  Substance Use Topics  . Alcohol use: Yes    Alcohol/week: 0.0 standard drinks  . Drug use: No    Home Medications Prior to Admission medications   Medication Sig Start Date End Date Taking? Authorizing Provider  calcium-vitamin D (OSCAL WITH D) 500-200 MG-UNIT tablet Take 1 tablet by mouth daily with breakfast.    [provider]  folic acid (FOLVITE) 1 MG tablet Take 1 mg by mouth daily.    [provider]  Nutritional Supplements (JUICE PLUS FIBRE PO) Take 1 tablet by mouth daily.    [provider]  terbinafine (LAMISIL) 250 MG tablet Take 1 tablet (250 mg total) by mouth daily. 11/04/17   Felecia Shelling, DPM    Allergies    Dicyclomine hcl  Review of Systems   Review of Systems  Gastrointestinal: Positive for abdominal pain, nausea and vomiting.  All other systems reviewed and are negative.   Physical Exam Updated Vital Signs BP 104/73   Pulse 80   Temp (!) 97.5 F (36.4 C) (Oral)   Resp 16   Ht 5\' 1"  (1.549 m)   Wt 68 kg   LMP 05/08/2012   SpO2  98%   BMI 28.34 kg/m   Physical Exam Vitals and nursing note reviewed.  Constitutional:      General: She is not in acute distress.    Appearance: She is well-developed.     Comments: Appears nontoxic  HENT:     Head: Normocephalic and atraumatic.  Eyes:     Conjunctiva/sclera: Conjunctivae normal.     Pupils: Pupils are equal, round, and reactive to light.  Cardiovascular:     Rate and Rhythm: Normal rate and regular rhythm.     Pulses: Normal pulses.  Pulmonary:     Effort: Pulmonary effort is normal. No respiratory distress.     Breath sounds: Normal breath sounds. No wheezing.  Abdominal:     General: There is no distension.     Palpations: Abdomen is soft. There is no mass.     Tenderness: There is generalized abdominal tenderness. There is no guarding or  rebound.     Comments: Diffuse tenderness palpation of the abdomen.  Large surgical scar noted.  No rigidity, guarding, distention.  Hypoactive bowel sounds  Musculoskeletal:        General: Normal range of motion.     Cervical back: Normal range of motion and neck supple.  Skin:    General: Skin is warm and dry.     Capillary Refill: Capillary refill takes less than 2 seconds.  Neurological:     Mental Status: She is alert and oriented to person, place, and time.     ED Results / Procedures / Treatments   Labs (all labs ordered are listed, but only abnormal results are displayed) Labs Reviewed  CBC WITH DIFFERENTIAL/PLATELET - Abnormal; Notable for the following components:      Result Value   WBC 11.2 (*)    Neutro Abs 8.9 (*)    All other components within normal limits  COMPREHENSIVE METABOLIC PANEL - Abnormal; Notable for the following components:   Glucose, Bld 149 (*)    All other components within normal limits  RESP PANEL BY RT-PCR (FLU A&B, COVID) [id number]  LIPASE, BLOOD    EKG None  Radiology CT ABDOMEN PELVIS WO CONTRAST  Result Date: 07/08/2020 CLINICAL DATA:  55 year old female with history of generalized abdominal pain radiating into the back for the past 12 hours with associated nausea. History of bowel obstruction and Crohn's disease. EXAM: CT ABDOMEN AND PELVIS WITHOUT CONTRAST TECHNIQUE: Multidetector CT imaging of the abdomen and pelvis was performed following the standard protocol without IV contrast. COMPARISON:  CT the abdomen and pelvis 12/30/2017. FINDINGS: Lower chest: 2 mm nodule in the periphery of the right lower lobe (axial image 6 of series 4), stable compared to prior study from 2019, considered definitively benign requiring no future imaging follow-up. Hepatobiliary: Ill-defined low-attenuation lesion in segment 4A of the liver (axial image 14 of series 2) incompletely characterized on today's non-contrast CT examination, but new compared to prior CT  12/30/2017. Calcified gallstone lying dependently in the gallbladder measuring 1.5 cm in diameter. Gallbladder is moderately distended, but there is no pericholecystic fluid or surrounding inflammatory changes to indicate an acute diverticulitis at this time. Pancreas: No definite pancreatic mass or peripancreatic fluid collections or inflammatory changes are noted on today's noncontrast CT examination. Spleen: Unremarkable. Adrenals/Urinary Tract: There are no abnormal calcifications within the collecting system of either kidney, along the course of either ureter, or within the lumen of the urinary bladder. No hydroureteronephrosis or perinephric stranding to suggest urinary tract obstruction at this time. The  unenhanced appearance of the kidneys is unremarkable bilaterally. Unenhanced appearance of the urinary bladder is normal. Bilateral adrenal glands are normal in appearance. Stomach/Bowel: Unenhanced appearance of the stomach is normal. No pathologic dilatation of small bowel or colon. A few scattered colonic diverticulae are noted, without surrounding inflammatory changes to indicate an acute diverticulitis at this time. Postoperative changes are noted in the distal small bowel, suggesting prior terminal ileum resection with what appears to be some mild thickening of the neo terminal ileum (poorly evaluated on today's noncontrast CT examination) best appreciated on axial image 46 of series 2. Minimal inflammatory changes are noted adjacent to the neo terminal ileum. Mild diffuse prominence of small-bowel loops without frank dilatation or air-fluid levels to indicate obstruction. Appendix has been resected. Vascular/Lymphatic: No atherosclerotic calcifications in the abdominal aorta or pelvic vasculature. Reproductive: Unenhanced appearance of the uterus and ovaries is unremarkable. Other: No significant volume of ascites.  No pneumoperitoneum. Musculoskeletal: There are no aggressive appearing lytic or  blastic lesions noted in the visualized portions of the skeleton. IMPRESSION: 1. Mural thickening and mild inflammatory changes associated with the neo terminal ileum, which could suggest recurrent active Crohn's disease. At this time, this is not associated with frank bowel obstruction. 2. New but indeterminate intermediate attenuation lesion in segment 4A of the liver. This may simply represent some focal fatty infiltration, however, further characterization with nonemergent outpatient abdominal MRI with and without IV gadolinium is recommended in the near future to better characterize this finding. 3. Cholelithiasis without evidence of acute cholecystitis. 4. Additional incidental findings, as above. Electronically Signed   By: Trudie Reed M.D.   On: 07/08/2020 08:48    Procedures Procedures   Medications Ordered in ED Medications  fentaNYL (SUBLIMAZE) injection 100 mcg (100 mcg Intravenous Given 07/08/20 0800)  ondansetron (ZOFRAN) injection 4 mg (4 mg Intravenous Given 07/08/20 0759)  sodium chloride 0.9 % bolus 1,000 mL (1,000 mLs Intravenous New Bag/Given 07/08/20 0758)  oxyCODONE-acetaminophen (PERCOCET/ROXICET) 5-325 MG per tablet 1 tablet (1 tablet Oral Given 07/08/20 0908)  predniSONE (DELTASONE) tablet 40 mg (40 mg Oral Given 07/08/20 5885)    ED Course  I have reviewed the triage vital signs and the nursing notes.  Pertinent labs & imaging results that were available during my care of the patient were reviewed by me and considered in my medical decision making (see chart for details).    MDM Rules/Calculators/A&P                          Patient presenting for evaluation nausea, vomiting, Donnell pain.  States this feels similar to previous time she has had a bowel obstruction.  On exam, patient appears nontoxic.  She does have diffuse tenderness palpation of the abdomen with hypoactive bowel sounds.  Concern for bowel obstruction.  Also consider constipation.  Consider kidney  stone in the setting of acute onset pain and nausea vomiting.  Consider Crohn's flare.  Will obtain labs, treat symptomatically, and obtain CT abdomen pelvis for further evaluation.  Labs interpreted by me, overall reassuring.  CT abdomen pelvis consistent with acute Crohn's flare, but no signs of obstruction.  There is also an incidental finding of liver lesion.  Discussed this finding with patient, importance of outpatient follow-up.  On reevaluation, patient continues to have discomfort, although slightly improved.  No nausea or vomiting.  Will consult with GI.  Discussed with Dr. Matthias Hughs from [facility] who recommended prednisone taper, pain control, clear  liquid diet x24 hrs with progression as tolerated.  Recommended patient be offered choice for discharge vs 24-hour observation.  Discussed findings and plan with patient.  On reevaluation, patient continues to have significant discomfort.  She does not feel she is ready to go home. Will call for admission.   Discussed with Dr. Ronaldo Miyamoto from [facility], patient to be admitted  Final Clinical Impression(s) / ED Diagnoses Final diagnoses:  Acute Crohn's disease with complication (HCC)  Non-intractable vomiting with nausea, unspecified vomiting type  Diffuse abdominal pain  Liver lesion    Rx / DC Orders ED Discharge Orders    None       Alveria Apley, PA-C 07/08/20 1009    Rolan Bucco, MD 07/08/20 1232

## 2020-07-08 NOTE — ED Notes (Signed)
Ice chips provided and patient tolerated well.

## 2020-07-08 NOTE — H&P (Signed)
History and Physical    Shelley Sutton HWE:[id number] DOB: July 20, 1965 DOA: 07/08/2020  PCP: System, Provider Not In  Patient coming from: Home  Chief Complaint: abdominal pain, N/V  HPI: Shelley Sutton is a 55 y.o. female with medical history significant of crohns. Presenting with abdominal pain. Her symptoms started last night around 1930hrs. It was worst in the umbilical region, but moved globally. It was crampy pain that came in waves. She had associated bloating as well. She tried hydrocodone, and it provided some temporary relief. However, she woke this morning with N/V. She decided to come to the ED. She denies any other aggravating or alleviating factors.      ED Course: CT ab/pelvis showed an area of inflammation at terminal ileum. [facility] was consulted. [facility] was called for admission.   Review of Systems:  Denies CP, dyspnea, HA, palpitations, fever. Reports normal BM, abdominal pain. Review of systems is otherwise negative for all not mentioned in HPI.   PMHx Past Medical History:  Diagnosis Date  . Abdominal pain   . ADD (attention deficit disorder)   . Crohn's disease (HCC)   . Depression   . Depression   . Esophageal reflux   . Fatigue   . FH: ovarian cancer   . Fluttering sensation of heart   . Gallstones   . Gallstones   . Hypertriglyceridemia   . Onychomycosis   . Onychomycosis   . Palpitations     PSHx Past Surgical History:  Procedure Laterality Date  . COLON RESECTION    . COLONOSCOPY  09/1995, 06/1998,04/2002  . ESOPHAGOGASTRODUODENOSCOPY  07/1995  . PARTIAL COLECTOMY  2000  . TONSILLECTOMY  1979    SocHx  reports that she quit smoking about 23 years ago. She does not have any smokeless tobacco history on file. She reports current alcohol use. She reports that she does not use drugs.  Allergies  Allergen Reactions  . Dicyclomine Hcl Nausea And Vomiting    FamHx Family History  Problem Relation Age of Onset  . Ovarian cancer Maternal Grandmother    . Heart failure Maternal Grandfather   . Cancer Paternal Grandmother   . Brain cancer Paternal Grandfather     Prior to Admission medications   Medication Sig Start Date End Date Taking? Authorizing Provider  Ascorbic Acid (VITAMIN C PO) Take 1 tablet by mouth daily.   Yes [provider]  B Complex Vitamins (VITAMIN B-COMPLEX PO) Take 1 tablet by mouth daily.   Yes [provider]  Multiple Vitamin (MULTIVITAMIN WITH MINERALS) TABS tablet Take 1 tablet by mouth daily.   Yes [provider]  oxymetazoline (AFRIN) 0.05 % nasal spray Place 1 spray into both nostrils 2 (two) times daily as needed for congestion.   Yes [provider]    Physical Exam: Vitals:   07/08/20 0728 07/08/20 0905  BP: 127/86 104/73  Pulse: 60 80  Resp: 18 16  Temp: (!) 97.5 F (36.4 C)   TempSrc: Oral   SpO2: 100% 98%  Weight: 68 kg   Height: 5\' 1"  (1.549 m)     General: 55 y.o. female resting in bed in NAD Eyes: PERRL, normal sclera ENMT: Nares patent w/o discharge, orophaynx clear, dentition normal, ears w/o discharge/lesions/ulcers Neck: Supple, trachea midline Cardiovascular: RRR, +S1, S2, no m/g/r, equal pulses throughout Respiratory: CTABL, no w/r/r, normal WOB GI: BS+, ND, RLQ/RUQ TTP, no masses noted, no organomegaly noted MSK: No e/c/c Skin: No rashes, bruises, ulcerations noted Neuro: A&O  x 3, no focal deficits Psyc: Appropriate interaction and affect, calm/cooperative  Labs on Admission: I have personally reviewed following labs and imaging studies  CBC: Recent Labs  Lab 07/08/20 0751  WBC 11.2*  NEUTROABS 8.9*  HGB 13.9  HCT 41.5  MCV 85.7  PLT 243   Basic Metabolic Panel: Recent Labs  Lab 07/08/20 0751  NA 140  K 3.8  CL 103  CO2 27  GLUCOSE 149*  BUN 17  CREATININE 0.79  CALCIUM 10.3   GFR: Estimated Creatinine Clearance: 70.9 mL/min (by C-G formula based on SCr of 0.79 mg/dL). Liver Function Tests: Recent Labs  Lab  07/08/20 0751  AST 24  ALT 25  ALKPHOS 70  BILITOT 0.6  PROT 7.9  ALBUMIN 4.5   Recent Labs  Lab 07/08/20 0751  LIPASE 37   No results for input(s): AMMONIA in the last 168 hours. Coagulation Profile: No results for input(s): INR, PROTIME in the last 168 hours. Cardiac Enzymes: No results for input(s): CKTOTAL, CKMB, CKMBINDEX, TROPONINI in the last 168 hours. BNP (last 3 results) No results for input(s): PROBNP in the last 8760 hours. HbA1C: No results for input(s): HGBA1C in the last 72 hours. CBG: No results for input(s): GLUCAP in the last 168 hours. Lipid Profile: No results for input(s): CHOL, HDL, LDLCALC, TRIG, CHOLHDL, LDLDIRECT in the last 72 hours. Thyroid Function Tests: No results for input(s): TSH, T4TOTAL, FREET4, T3FREE, THYROIDAB in the last 72 hours. Anemia Panel: No results for input(s): VITAMINB12, FOLATE, FERRITIN, TIBC, IRON, RETICCTPCT in the last 72 hours. Urine analysis:    Component Value Date/Time   COLORURINE AMBER (A) 06/04/2012 0640   APPEARANCEUR CLOUDY (A) 06/04/2012 0640   LABSPEC 1.038 (H) 06/04/2012 0640   PHURINE 5.0 06/04/2012 0640   GLUCOSEU NEGATIVE 06/04/2012 0640   HGBUR NEGATIVE 06/04/2012 0640   BILIRUBINUR SMALL (A) 06/04/2012 0640   KETONESUR 15 (A) 06/04/2012 0640   PROTEINUR NEGATIVE 06/04/2012 0640   UROBILINOGEN 0.2 06/04/2012 0640   NITRITE NEGATIVE 06/04/2012 0640   LEUKOCYTESUR NEGATIVE 06/04/2012 0640    Radiological Exams on Admission: CT ABDOMEN PELVIS WO CONTRAST  Result Date: 07/08/2020 CLINICAL DATA:  55 year old female with history of generalized abdominal pain radiating into the back for the past 12 hours with associated nausea. History of bowel obstruction and Crohn's disease. EXAM: CT ABDOMEN AND PELVIS WITHOUT CONTRAST TECHNIQUE: Multidetector CT imaging of the abdomen and pelvis was performed following the standard protocol without IV contrast. COMPARISON:  CT the abdomen and pelvis 12/30/2017.  FINDINGS: Lower chest: 2 mm nodule in the periphery of the right lower lobe (axial image 6 of series 4), stable compared to prior study from 2019, considered definitively benign requiring no future imaging follow-up. Hepatobiliary: Ill-defined low-attenuation lesion in segment 4A of the liver (axial image 14 of series 2) incompletely characterized on today's non-contrast CT examination, but new compared to prior CT 12/30/2017. Calcified gallstone lying dependently in the gallbladder measuring 1.5 cm in diameter. Gallbladder is moderately distended, but there is no pericholecystic fluid or surrounding inflammatory changes to indicate an acute diverticulitis at this time. Pancreas: No definite pancreatic mass or peripancreatic fluid collections or inflammatory changes are noted on today's noncontrast CT examination. Spleen: Unremarkable. Adrenals/Urinary Tract: There are no abnormal calcifications within the collecting system of either kidney, along the course of either ureter, or within the lumen of the urinary bladder. No hydroureteronephrosis or perinephric stranding to suggest urinary tract obstruction at this time. The unenhanced appearance of the kidneys  is unremarkable bilaterally. Unenhanced appearance of the urinary bladder is normal. Bilateral adrenal glands are normal in appearance. Stomach/Bowel: Unenhanced appearance of the stomach is normal. No pathologic dilatation of small bowel or colon. A few scattered colonic diverticulae are noted, without surrounding inflammatory changes to indicate an acute diverticulitis at this time. Postoperative changes are noted in the distal small bowel, suggesting prior terminal ileum resection with what appears to be some mild thickening of the neo terminal ileum (poorly evaluated on today's noncontrast CT examination) best appreciated on axial image 46 of series 2. Minimal inflammatory changes are noted adjacent to the neo terminal ileum. Mild diffuse prominence of  small-bowel loops without frank dilatation or air-fluid levels to indicate obstruction. Appendix has been resected. Vascular/Lymphatic: No atherosclerotic calcifications in the abdominal aorta or pelvic vasculature. Reproductive: Unenhanced appearance of the uterus and ovaries is unremarkable. Other: No significant volume of ascites.  No pneumoperitoneum. Musculoskeletal: There are no aggressive appearing lytic or blastic lesions noted in the visualized portions of the skeleton. IMPRESSION: 1. Mural thickening and mild inflammatory changes associated with the neo terminal ileum, which could suggest recurrent active Crohn's disease. At this time, this is not associated with frank bowel obstruction. 2. New but indeterminate intermediate attenuation lesion in segment 4A of the liver. This may simply represent some focal fatty infiltration, however, further characterization with nonemergent outpatient abdominal MRI with and without IV gadolinium is recommended in the near future to better characterize this finding. 3. Cholelithiasis without evidence of acute cholecystitis. 4. Additional incidental findings, as above. Electronically Signed   By: Trudie Reed M.D.   On: 07/08/2020 08:48    EKG: None obtained in ED  Assessment/Plan Crohn's Flare     - place in obs, med-surg     - fluids, steroids, CLD     - pain control, nausea control     - [facility] to see, appreciate assistance  Hyperglycemia     - no history of DM     - check A1c  DVT prophylaxis: SCDs  Code Status: FULL  Family Communication: w/ husband at bed  Consults called: EDP spoke with [facility] (Buccini).   Status is: Observation  The patient remains OBS appropriate and will d/c before 2 midnights.  Dispo: The patient is from: Home              Anticipated d/c is to: Home              Patient currently is not medically stable to d/c.   Difficult to place patient No  Time spent coordinating admission: 40 minutes  Anuar Walgren A Khloei Spiker  DO [facility]  If 7PM-7AM, please contact night-coverage [email or web address]  07/08/2020, 10:44 AM

## 2020-07-08 NOTE — ED Notes (Signed)
Lunch tray given. 

## 2020-07-08 NOTE — ED Triage Notes (Signed)
Generalized abd pain radiating through to back x 12 hrs with Vomiting. Pt states hx of bowel obstruction and chron's disease.

## 2020-07-08 NOTE — Consult Note (Signed)
Referring Provider: [facility] Primary Care Physician:  System, Provider Not In Primary Gastroenterologist:  Dr. Bernette Redbirdobert Tuyen Uncapher  Reason for Consultation: Crohn's disease with abdominal pain  HPI: Shelley Sutton is a 55 y.o. female admitted through the emergency room today because of abdominal pain.    The patient is well-known to me, having been followed by me in the office for the past 25 years for Crohn's ileitis, necessitating a cecal and TI resection (approximately 15 to 20 cm) for stenosis in 2000.   Her course has been very benign since then.  She has been off medications for Crohn's since the year 2004, and I have not seen her in the office for the past 2-1/2 years.  Her last ER visit for obstructive symptoms was in 2019, at which time no obstruction was evident on CT scanning.  Her most recent colonoscopy, October 2019, showed some anastomotic stenosis and friability, followed by CT enterography that did not show any significant inflammation or stricturing.  She does have known gallstones, which have been clinically quiescent.    With that background, the abdominal pain that prompted admission yesterday began around 7:30 PM.  It was diffuse, fairly intense, and culminated in fairly extensive vomiting, particularly after dinner.  It began after eating a large salad with a whole carrot and cauliflower pizza.  There had not been any prodromal abdominal symptoms prior to yesterday evening.  A CT of the abdomen in the emergency room today showed possibly some inflammatory change in the ileal region, nothing dramatic, but no obstruction.  At this time, following pain medication in the emergency room, the patient is feeling much better.   Past Medical History:  Diagnosis Date  . Abdominal pain   . ADD (attention deficit disorder)   . Crohn's disease (HCC)   . Depression   . Depression   . Esophageal reflux   . Fatigue   . FH: ovarian cancer   . Fluttering sensation of heart    . Gallstones   . Gallstones   . Hypertriglyceridemia   . Onychomycosis   . Onychomycosis   . Palpitations     Past Surgical History:  Procedure Laterality Date  . COLON RESECTION    . COLONOSCOPY  09/1995, 06/1998,04/2002  . ESOPHAGOGASTRODUODENOSCOPY  07/1995  . PARTIAL COLECTOMY  2000  . TONSILLECTOMY  1979    Prior to Admission medications   Medication Sig Start Date End Date Taking? Authorizing Provider  Ascorbic Acid (VITAMIN C PO) Take 1 tablet by mouth daily.   Yes [provider]  B Complex Vitamins (VITAMIN B-COMPLEX PO) Take 1 tablet by mouth daily.   Yes [provider]  Multiple Vitamin (MULTIVITAMIN WITH MINERALS) TABS tablet Take 1 tablet by mouth daily.   Yes [provider]  oxymetazoline (AFRIN) 0.05 % nasal spray Place 1 spray into both nostrils 2 (two) times daily as needed for congestion.   Yes [provider]    Current Facility-Administered Medications  Medication Dose Route Frequency Provider Last Rate Last Admin  . 0.9 %  sodium chloride infusion   Intravenous Once Ronaldo MiyamotoKyle, Tyrone A, DO      . acetaminophen (TYLENOL) tablet 650 mg  650 mg Oral Q6H PRN Ronaldo MiyamotoKyle, Tyrone A, DO       Or  . acetaminophen (TYLENOL) suppository 650 mg  650 mg Rectal Q6H PRN Ronaldo MiyamotoKyle, Tyrone A, DO      . HYDROcodone-acetaminophen (NORCO/VICODIN) 5-325 MG per tablet 1-2 tablet  1-2 tablet Oral Q4H  PRN Margie Ege A, DO      . morphine 4 MG/ML injection 4 mg  4 mg Intravenous Q4H PRN Ronaldo Miyamoto, Tyrone A, DO      . multivitamin with minerals tablet 1 tablet  1 tablet Oral Daily Kyle, Tyrone A, DO      . ondansetron (ZOFRAN) tablet 4 mg  4 mg Oral Q6H PRN Ronaldo Miyamoto, Tyrone A, DO       Or  . ondansetron (ZOFRAN) injection 4 mg  4 mg Intravenous Q6H PRN Ronaldo Miyamoto, Tyrone A, DO      . oxymetazoline (AFRIN) 0.05 % nasal spray 1 spray  1 spray Each Nare BID PRN Ronaldo Miyamoto, Tyrone A, DO      . [START ON 07/09/2020] predniSONE (DELTASONE) tablet 40 mg  40 mg Oral Q breakfast Kyle, Tyrone  A, DO        Allergies as of 07/08/2020 - Review Complete 07/08/2020  Allergen Reaction Noted  . Dicyclomine hcl Nausea And Vomiting 12/04/2014    Family History  Problem Relation Age of Onset  . Ovarian cancer Maternal Grandmother   . Heart failure Maternal Grandfather   . Cancer Paternal Grandmother   . Brain cancer Paternal Grandfather     Social History   Socioeconomic History  . Marital status: Married    Spouse name: Not on file  . Number of children: 4  . Years of education: Not on file  . Highest education level: Not on file  Occupational History  . Occupation: EMPLOYED  Tobacco Use  . Smoking status: Former Smoker    Quit date: 12/03/1996    Years since quitting: 23.6  . Smokeless tobacco: Not on file  Substance and Sexual Activity  . Alcohol use: Yes    Alcohol/week: 0.0 standard drinks  . Drug use: No  . Sexual activity: Not on file  Other Topics Concern  . Not on file  Social History Narrative  . Not on file   Social Determinants of Health   Financial Resource Strain: Not on file  Food Insecurity: Not on file  Transportation Needs: Not on file  Physical Activity: Not on file  Stress: Not on file  Social Connections: Not on file  Intimate Partner Violence: Not on file    Review of Systems: See HPI  Physical Exam: Vital signs in last 24 hours: Temp:  [97.5 F (36.4 C)-98.5 F (36.9 C)] 98.5 F (36.9 C) (05/29 1341) Pulse Rate:  [60-95] 76 (05/29 1341) Resp:  [16-20] 18 (05/29 1341) BP: (104-127)/(73-86) 110/78 (05/29 1341) SpO2:  [95 %-100 %] 95 % (05/29 1341) Weight:  [68 kg] 68 kg (05/29 0728) Last BM Date: 07/07/20 General:   Alert,  Well-developed, well-nourished, pleasant and cooperative in NAD Head:  Normocephalic and atraumatic. Eyes:  Sclera clear, no icterus.    Lungs:  Clear throughout to auscultation.   No wheezes, crackles, or rhonchi. No evident respiratory distress. Heart:   Regular rate and rhythm; no murmurs, clicks,  rubs,  or gallops. Abdomen:  Soft, nontender, and nondistended. No masses, hepatosplenomegaly or ventral hernias noted. Quiet bowel sounds (after pain medication), without bruits, guarding, or rebound.   Msk:   Symmetrical without gross deformities. Neurologic:  Alert and coherent;  grossly normal neurologically. Skin:  Intact without significant lesions or rashes. Psych:   Alert and cooperative. Normal mood and affect.  Intake/Output from previous day: No intake/output data recorded. Intake/Output this shift: No intake/output data recorded.  Lab Results: Recent Labs    07/08/20 0751  WBC 11.2*  HGB 13.9  HCT 41.5  PLT 243   BMET Recent Labs    07/08/20 0751  NA 140  K 3.8  CL 103  CO2 27  GLUCOSE 149*  BUN 17  CREATININE 0.79  CALCIUM 10.3   LFT Recent Labs    07/08/20 0751  PROT 7.9  ALBUMIN 4.5  AST 24  ALT 25  ALKPHOS 70  BILITOT 0.6   PT/INR No results for input(s): LABPROT, INR in the last 72 hours.  Studies/Results: CT ABDOMEN PELVIS WO CONTRAST  Result Date: 07/08/2020 CLINICAL DATA:  55 year old female with history of generalized abdominal pain radiating into the back for the past 12 hours with associated nausea. History of bowel obstruction and Crohn's disease. EXAM: CT ABDOMEN AND PELVIS WITHOUT CONTRAST TECHNIQUE: Multidetector CT imaging of the abdomen and pelvis was performed following the standard protocol without IV contrast. COMPARISON:  CT the abdomen and pelvis 12/30/2017. FINDINGS: Lower chest: 2 mm nodule in the periphery of the right lower lobe (axial image 6 of series 4), stable compared to prior study from 2019, considered definitively benign requiring no future imaging follow-up. Hepatobiliary: Ill-defined low-attenuation lesion in segment 4A of the liver (axial image 14 of series 2) incompletely characterized on today's non-contrast CT examination, but new compared to prior CT 12/30/2017. Calcified gallstone lying dependently in the  gallbladder measuring 1.5 cm in diameter. Gallbladder is moderately distended, but there is no pericholecystic fluid or surrounding inflammatory changes to indicate an acute diverticulitis at this time. Pancreas: No definite pancreatic mass or peripancreatic fluid collections or inflammatory changes are noted on today's noncontrast CT examination. Spleen: Unremarkable. Adrenals/Urinary Tract: There are no abnormal calcifications within the collecting system of either kidney, along the course of either ureter, or within the lumen of the urinary bladder. No hydroureteronephrosis or perinephric stranding to suggest urinary tract obstruction at this time. The unenhanced appearance of the kidneys is unremarkable bilaterally. Unenhanced appearance of the urinary bladder is normal. Bilateral adrenal glands are normal in appearance. Stomach/Bowel: Unenhanced appearance of the stomach is normal. No pathologic dilatation of small bowel or colon. A few scattered colonic diverticulae are noted, without surrounding inflammatory changes to indicate an acute diverticulitis at this time. Postoperative changes are noted in the distal small bowel, suggesting prior terminal ileum resection with what appears to be some mild thickening of the neo terminal ileum (poorly evaluated on today's noncontrast CT examination) best appreciated on axial image 46 of series 2. Minimal inflammatory changes are noted adjacent to the neo terminal ileum. Mild diffuse prominence of small-bowel loops without frank dilatation or air-fluid levels to indicate obstruction. Appendix has been resected. Vascular/Lymphatic: No atherosclerotic calcifications in the abdominal aorta or pelvic vasculature. Reproductive: Unenhanced appearance of the uterus and ovaries is unremarkable. Other: No significant volume of ascites.  No pneumoperitoneum. Musculoskeletal: There are no aggressive appearing lytic or blastic lesions noted in the visualized portions of the  skeleton. IMPRESSION: 1. Mural thickening and mild inflammatory changes associated with the neo terminal ileum, which could suggest recurrent active Crohn's disease. At this time, this is not associated with frank bowel obstruction. 2. New but indeterminate intermediate attenuation lesion in segment 4A of the liver. This may simply represent some focal fatty infiltration, however, further characterization with nonemergent outpatient abdominal MRI with and without IV gadolinium is recommended in the near future to better characterize this finding. 3. Cholelithiasis without evidence of acute cholecystitis. 4. Additional incidental findings, as above. Electronically Signed   By: Reuel Boom  Entrikin M.D.   On: 07/08/2020 08:48    Impression: 1.  Abrupt onset of diffuse abdominal pain; I suspect this is due to mechanical obstruction of a slightly stenotic ileocolonic anastomosis, from large amount of vegetable debris consumed prior to the onset of symptoms 2.  History of Crohn's ileitis with possible mild inflammatory changes at present, but no overt bowel obstruction 3.  New spot on the liver on CT scanning, dubious significance, MRI follow-up recommended  Plan: 1.  Agree with clear liquid diet for now.  If feeling fairly well in the morning, I would advance to a low residue diet 2.  Transient prednisone therapy to address any element of swelling due to active inflammation that may be present 3.  Consider follow-up MRI scan to better characterize the hepatic lesion seen as an incidental finding on today's CT   LOS: 0 days   Katy Fitch Jashawn Floyd  07/08/2020, 2:55 PM   Pager [phone] If no answer or after 5 PM call [phone]

## 2020-07-09 DIAGNOSIS — R112 Nausea with vomiting, unspecified: Secondary | ICD-10-CM

## 2020-07-09 DIAGNOSIS — R1084 Generalized abdominal pain: Secondary | ICD-10-CM

## 2020-07-09 LAB — COMPREHENSIVE METABOLIC PANEL
ALT: 19 U/L (ref 0–44)
AST: 18 U/L (ref 15–41)
Albumin: 3.4 g/dL — ABNORMAL LOW (ref 3.5–5.0)
Alkaline Phosphatase: 52 U/L (ref 38–126)
Anion gap: 5 (ref 5–15)
BUN: 9 mg/dL (ref 6–20)
CO2: 27 mmol/L (ref 22–32)
Calcium: 8.9 mg/dL (ref 8.9–10.3)
Chloride: 109 mmol/L (ref 98–111)
Creatinine, Ser: 0.67 mg/dL (ref 0.44–1.00)
GFR, Estimated: >60 mL/min (ref >60)
Glucose, Bld: 103 mg/dL — ABNORMAL HIGH (ref 70–99)
Potassium: 4 mmol/L (ref 3.5–5.1)
Sodium: 141 mmol/L (ref 135–145)
Total Bilirubin: 0.3 mg/dL (ref 0.3–1.2)
Total Protein: 6.3 g/dL — ABNORMAL LOW (ref 6.5–8.1)

## 2020-07-09 LAB — CBC
HCT: 36.5 % (ref 36.0–46.0)
Hemoglobin: 11.8 g/dL — ABNORMAL LOW (ref 12.0–15.0)
MCH: 28.9 pg (ref 26.0–34.0)
MCHC: 32.3 g/dL (ref 30.0–36.0)
MCV: 89.2 fL (ref 80.0–100.0)
Platelets: 217 10*3/uL (ref 150–400)
RBC: 4.09 MIL/uL (ref 3.87–5.11)
RDW: 13.2 % (ref 11.5–15.5)
WBC: 9.6 10*3/uL (ref 4.0–10.5)
nRBC: 0 % (ref 0.0–0.2)

## 2020-07-09 MED ORDER — PREDNISONE 10 MG PO TABS: ORAL_TABLET | ORAL | 0 refills | Status: AC

## 2020-07-09 NOTE — Discharge Summary (Addendum)
Physician Discharge Summary  RAWAN RIENDEAU Sutton:[id number] DOB: 01/24/1966 DOA: 07/08/2020  PCP: Morrell Riddle, PA-C  Admit date: 07/08/2020 Discharge date: 07/09/2020  Recommendations for Outpatient Follow-up:    Modest random hyperglycemia on admission, fasting blood sugar this morning 103, mildly elevated.  Follow-up as an outpatient.    New but indeterminate intermediate attenuation lesion in segment  4A of the liver. This may simply represent some focal fatty infiltration, however, further characterization with nonemergent outpatient abdominal MRI with and without IV gadolinium is recommended in the near future to better characterize this finding.  Per pt Dr. Matthias Hughs GI will follow-up   Follow-up Information    Sutton, Shelley Severin, PA-C Follow up.   Specialty: Physician Assistant Why: keep follow-up schedule Contact information: [address] [place] [address] [phone]        Shelley Redbird, MD Follow up.   Specialty: Gastroenterology Why: as needed. He will set up MRI. Contact information: [address] [place] [address] [phone]                Discharge Diagnoses: Principal diagnosis is #1 Active Problems:   Crohn's disease [name])   Discharge Condition: improved Disposition: home  Diet recommendation:  Diet Orders (From admission, onward)    Start     Ordered   07/09/20 1022  Diet full liquid Room service appropriate? Yes; Fluid consistency: Thin  Diet effective now       Question Answer Comment  Room service appropriate? Yes   Fluid consistency: Thin      07/09/20 1021           Filed Weights   07/08/20 0728  Weight: 68 kg    HPI/Hospital Course:   55 year old woman PMH Crohn's disease presented with abdominal pain.  CT was concerning for acute inflammation terminal ileum.  Admitted for observation, concern for Crohn's flare, GI consultation.  Symptoms resolved rapidly.  Seen by GI with impression  being transient abdominal pain from transient mechanical obstruction and quiescent Crohn's ileitis.  Plan for discharge home on few days of prednisone and outpatient follow-up as needed.  Transient abdominal pain w/ n/v thought secondary to transient mechanical obstruction of slightly stenotic ileocolonic anastomosis from consumption of large amounts of vegetable material.  PMH quiescent Crohn's ileitis without radiographic or clinical evidence to suggest a recent flareup. -- Complete short course of prednisone 30 mg for 2 days then 20 mg for 2 days then 10 mg for 2 days then stop  Modest random hyperglycemia on admission, fasting blood sugar this morning 103, mildly elevated.  Follow-up as an outpatient.   New but indeterminate intermediate attenuation lesion in segment 4A of the liver. This may simply represent some focal fatty infiltration, however, further characterization with nonemergent outpatient abdominal MRI with and without IV gadolinium is recommended in the near future to better characterize this finding.  Consults:  . GI  Today's assessment: S: CC: f/u abd pain  Feels well today, no pain, tolerating liquids. Ready to go home. Wonders if presenting symptoms from shingles vaccine.  O: Vitals:  Vitals:   07/09/20 0616 07/09/20 0921  BP: 95/61 101/66  Pulse: 80 76  Resp: 18 18  Temp: 98.3 F (36.8 C) (!) 97.5 F (36.4 C)  SpO2: 97% 98%    Constitutional:  . Appears calm and comfortable Respiratory:  . CTA bilaterally, no w/r/r.  . Respiratory effort normal. Cardiovascular:  . RRR, no m/r/g . No LE extremity  edema   Abdomen:  . soft Psychiatric:  . Mental status o Mood, affect appropriate   CMP unremarkable WBC WNL Hgb 11.8  Discharge Instructions  Discharge Instructions    Discharge instructions   Complete by: As directed    Call your physician or seek immediate medical attention for pain, vomiting, abdominal pain, fever or worsening of  condition. Start with liquid diet and advance as tolerated.   Increase activity slowly   Complete by: As directed      Allergies as of 07/09/2020      Reactions   Dicyclomine Hcl Nausea And Vomiting      Medication List    TAKE these medications   multivitamin with minerals Tabs tablet Take 1 tablet by mouth daily.   oxymetazoline 0.05 % nasal spray Commonly known as: AFRIN Place 1 spray into both nostrils 2 (two) times daily as needed for congestion.   predniSONE 10 MG tablet Commonly known as: DELTASONE Take 3 tablets (30 mg total) by mouth daily with breakfast for 2 days, THEN 2 tablets (20 mg total) daily with breakfast for 2 days, THEN 1 tablet (10 mg total) daily with breakfast for 2 days. Start taking on: Jul 10, 2020   VITAMIN B-COMPLEX PO Take 1 tablet by mouth daily.   VITAMIN C PO Take 1 tablet by mouth daily.      Allergies  Allergen Reactions  . Dicyclomine Hcl Nausea And Vomiting    The results of significant diagnostics from this hospitalization (including imaging, microbiology, ancillary and laboratory) are listed below for reference.    Significant Diagnostic Studies: CT ABDOMEN PELVIS WO CONTRAST  Result Date: 07/08/2020 CLINICAL DATA:  55 year old female with history of generalized abdominal pain radiating into the back for the past 12 hours with associated nausea. History of bowel obstruction and Crohn's disease. EXAM: CT ABDOMEN AND PELVIS WITHOUT CONTRAST TECHNIQUE: Multidetector CT imaging of the abdomen and pelvis was performed following the standard protocol without IV contrast. COMPARISON:  CT the abdomen and pelvis 12/30/2017. FINDINGS: Lower chest: 2 mm nodule in the periphery of the right lower lobe (axial image 6 of series 4), stable compared to prior study from 2019, considered definitively benign requiring no future imaging follow-up. Hepatobiliary: Ill-defined low-attenuation lesion in segment 4A of the liver (axial image 14 of series 2)  incompletely characterized on today's non-contrast CT examination, but new compared to prior CT 12/30/2017. Calcified gallstone lying dependently in the gallbladder measuring 1.5 cm in diameter. Gallbladder is moderately distended, but there is no pericholecystic fluid or surrounding inflammatory changes to indicate an acute diverticulitis at this time. Pancreas: No definite pancreatic mass or peripancreatic fluid collections or inflammatory changes are noted on today's noncontrast CT examination. Spleen: Unremarkable. Adrenals/Urinary Tract: There are no abnormal calcifications within the collecting system of either kidney, along the course of either ureter, or within the lumen of the urinary bladder. No hydroureteronephrosis or perinephric stranding to suggest urinary tract obstruction at this time. The unenhanced appearance of the kidneys is unremarkable bilaterally. Unenhanced appearance of the urinary bladder is normal. Bilateral adrenal glands are normal in appearance. Stomach/Bowel: Unenhanced appearance of the stomach is normal. No pathologic dilatation of small bowel or colon. A few scattered colonic diverticulae are noted, without surrounding inflammatory changes to indicate an acute diverticulitis at this time. Postoperative changes are noted in the distal small bowel, suggesting prior terminal ileum resection with what appears to be some mild thickening of the neo terminal ileum (poorly evaluated on today's noncontrast  CT examination) best appreciated on axial image 46 of series 2. Minimal inflammatory changes are noted adjacent to the neo terminal ileum. Mild diffuse prominence of small-bowel loops without frank dilatation or air-fluid levels to indicate obstruction. Appendix has been resected. Vascular/Lymphatic: No atherosclerotic calcifications in the abdominal aorta or pelvic vasculature. Reproductive: Unenhanced appearance of the uterus and ovaries is unremarkable. Other: No significant volume of  ascites.  No pneumoperitoneum. Musculoskeletal: There are no aggressive appearing lytic or blastic lesions noted in the visualized portions of the skeleton. IMPRESSION: 1. Mural thickening and mild inflammatory changes associated with the neo terminal ileum, which could suggest recurrent active Crohn's disease. At this time, this is not associated with frank bowel obstruction. 2. New but indeterminate intermediate attenuation lesion in segment 4A of the liver. This may simply represent some focal fatty infiltration, however, further characterization with nonemergent outpatient abdominal MRI with and without IV gadolinium is recommended in the near future to better characterize this finding. 3. Cholelithiasis without evidence of acute cholecystitis. 4. Additional incidental findings, as above. Electronically Signed   By: Trudie Reed M.D.   On: 07/08/2020 08:48    Microbiology: Recent Results (from the past 240 hour(s))  Resp Panel by RT-PCR (Flu A&B, Covid) Nasopharyngeal Swab     Status: None   Collection Time: 07/08/20  9:34 AM   Specimen: Nasopharyngeal Swab; Nasopharyngeal(NP) swabs in vial transport medium  Result Value Ref Range Status   SARS Coronavirus 2 by RT PCR NEGATIVE NEGATIVE Final    Comment: (NOTE) SARS-CoV-2 target nucleic acids are NOT DETECTED.  The SARS-CoV-2 RNA is generally detectable in upper respiratory specimens during the acute phase of infection. The lowest concentration of SARS-CoV-2 viral copies this assay can detect is 138 copies/mL. A negative result does not preclude SARS-Cov-2 infection and should not be used as the sole basis for treatment or other patient management decisions. A negative result may occur with  improper specimen collection/handling, submission of specimen other than nasopharyngeal swab, presence of viral mutation(s) within the areas targeted by this assay, and inadequate number of viral copies(<138 copies/mL). A negative result must be  combined with clinical observations, patient history, and epidemiological information. The expected result is Negative.  Fact Sheet for Patients:  BloggerCourse.com  Fact Sheet for Healthcare Providers:  SeriousBroker.it  This test is no t yet approved or cleared by the [place] FDA and  has been authorized for detection and/or diagnosis of SARS-CoV-2 by FDA under an Emergency Use Authorization (EUA). This EUA will remain  in effect (meaning this test can be used) for the duration of the COVID-19 declaration under Section 564(b)(1) of the Act, 21 U.S.C.section 360bbb-3(b)(1), unless the authorization is terminated  or revoked sooner.       Influenza A by PCR NEGATIVE NEGATIVE Final   Influenza B by PCR NEGATIVE NEGATIVE Final    Comment: (NOTE) The Xpert Xpress SARS-CoV-2/FLU/RSV plus assay is intended as an aid in the diagnosis of influenza from Nasopharyngeal swab specimens and should not be used as a sole basis for treatment. Nasal washings and aspirates are unacceptable for Xpert Xpress SARS-CoV-2/FLU/RSV testing.  Fact Sheet for Patients: BloggerCourse.com  Fact Sheet for Healthcare Providers: SeriousBroker.it  This test is not yet approved or cleared by the [place] FDA and has been authorized for detection and/or diagnosis of SARS-CoV-2 by FDA under an Emergency Use Authorization (EUA). This EUA will remain in effect (meaning this test can be used) for the duration of the COVID-19 declaration under  Section 564(b)(1) of the Act, 21 U.S.C. section 360bbb-3(b)(1), unless the authorization is terminated or revoked.  Performed at [facility], [address], [place], [place] [address]      Labs: Basic Metabolic Panel: Recent Labs  Lab 07/08/20 0751 07/09/20 0448  NA 140 141  K 3.8 4.0  CL 103 109  CO2 27 27  GLUCOSE 149* 103*  BUN  17 9  CREATININE 0.79 0.67  CALCIUM 10.3 8.9   Liver Function Tests: Recent Labs  Lab 07/08/20 0751 07/09/20 0448  AST 24 18  ALT 25 19  ALKPHOS 70 52  BILITOT 0.6 0.3  PROT 7.9 6.3*  ALBUMIN 4.5 3.4*   Recent Labs  Lab 07/08/20 0751  LIPASE 37   CBC: Recent Labs  Lab 07/08/20 0751 07/09/20 0448  WBC 11.2* 9.6  NEUTROABS 8.9*  --   HGB 13.9 11.8*  HCT 41.5 36.5  MCV 85.7 89.2  PLT 243 217    Active Problems:   Crohn's disease (HCC)   Time coordinating discharge: 25 minutes  Signed:  Brendia Sacks, MD  [facility]  07/09/2020, 12:29 PM

## 2020-07-09 NOTE — Progress Notes (Signed)
D/C instructions given to patient. Patient had no questions. NT or writer will wheel patient out once she is dressed  

## 2020-07-09 NOTE — Progress Notes (Signed)
VSS.  No new labs.  Feels well.  No bowel movements yet.  Tolerating clear liquids.  Subjectively, the patient feels ready to go home.  On exam, she is lying in bed in no distress whatsoever.  The abdomen is nondistended, soft, and completely nontender.  Impression:  1.  Transient abdominal pain which I suspect was due to transient mechanical obstruction of a slightly stenotic ileocolonic anastomosis, from consumption of large amounts of vegetable material  2.  History of quiescent Crohn's ileitis, without radiographic or clinical evidence to suggest a recent significant flareup  Recommendation:  1.  Okay for discharge from my standpoint.    2.  I would recommend prednisone 30 mg for 2 days starting tomorrow, then 20 mg for 2 days, then 10 mg for 2 days, and then stop.  (This should cover any element of acute inflammation that might be present, in case this was not a purely "mechanical" obstructive event.)  3.  Office follow-up with me is not required.  However, the patient knows to contact me if she has residual, persistent, or recurrent symptoms that she would like evaluated.  4.  I have started the patient on a full liquid diet.  She can advance to a low residue diet as tolerated.  Florencia Reasons, M.D. Pager [phone] If no answer or after 5 PM call [phone]

## 2020-07-10 LAB — HEMOGLOBIN A1C
Hgb A1c MFr Bld: 5.5 % (ref 4.8–5.6)
Hgb A1c MFr Bld: 5.4 % (ref 4.8–5.6)
Mean Plasma Glucose: 111 mg/dL
Mean Plasma Glucose: 108 mg/dL

## 2020-07-17 ENCOUNTER — Other Ambulatory Visit: Payer: Self-pay | Admitting: Gastroenterology

## 2020-07-17 DIAGNOSIS — R935 Abnormal findings on diagnostic imaging of other abdominal regions, including retroperitoneum: Secondary | ICD-10-CM

## 2020-07-17 DIAGNOSIS — K769 Liver disease, unspecified: Secondary | ICD-10-CM

## 2020-07-18 ENCOUNTER — Other Ambulatory Visit: Payer: Self-pay

## 2020-07-18 ENCOUNTER — Ambulatory Visit (INDEPENDENT_AMBULATORY_CARE_PROVIDER_SITE_OTHER): Payer: BC Managed Care – PPO | Admitting: Podiatry

## 2020-07-18 DIAGNOSIS — M79675 Pain in left toe(s): Secondary | ICD-10-CM

## 2020-07-18 DIAGNOSIS — B351 Tinea unguium: Secondary | ICD-10-CM | POA: Diagnosis not present

## 2020-07-18 DIAGNOSIS — M79674 Pain in right toe(s): Secondary | ICD-10-CM | POA: Diagnosis not present

## 2020-07-18 MED ORDER — TERBINAFINE HCL 250 MG PO TABS: 250.0000 mg | ORAL_TABLET | Freq: Every day | ORAL | 0 refills | Status: AC

## 2020-07-18 NOTE — Progress Notes (Signed)
   Subjective: 55 y.o. female presenting today as a new patient for evaluation of discoloration and thickening to the toenails bilaterally.  Patient states he does have a history of toenail fungus about 10 years ago when she took oral Lamisil which cleared it up.  She recently had blood work done and her liver function enzymes were within normal limits.  She presents for further treatment and evaluation  Past Medical History:  Diagnosis Date  . Abdominal pain   . ADD (attention deficit disorder)   . Crohn's disease (HCC)   . Depression   . Depression   . Esophageal reflux   . Fatigue   . FH: ovarian cancer   . Fluttering sensation of heart   . Gallstones   . Gallstones   . Hypertriglyceridemia   . Onychomycosis   . Onychomycosis   . Palpitations     Objective: Physical Exam General: The patient is alert and oriented x3 in no acute distress.  Dermatology: Hyperkeratotic, discolored, thickened, onychodystrophy noted to digits 1-5 bilateral. Skin is warm, dry and supple bilateral lower extremities. Negative for open lesions or macerations.  Vascular: Palpable pedal pulses bilaterally. No edema or erythema noted. Capillary refill within normal limits.  Neurological: Epicritic and protective threshold grossly intact bilaterally.   Musculoskeletal Exam: Range of motion within normal limits to all pedal and ankle joints bilateral. Muscle strength 5/5 in all groups bilateral.   Assessment: #1 Onychomycosis of toenails 1-5 bilateral   Plan of Care:  #1 Patient was evaluated. #2  Today we discussed different treatment options including oral, topical, and laser antifungal treatment modalities.  The patient opts for oral antifungal treatment modality.  She had recent blood work which was within normal limits.   #3 prescription for Lamisil 250 mg #90 daily #4 return to clinic 6 months   Shelley Sutton, DPM [facility]  Dr. Felecia Sutton, DPM    [address]                                        [place], [place] [address]                Office [phone]  Fax [phone]

## 2020-07-24 ENCOUNTER — Ambulatory Visit
Admission: RE | Admit: 2020-07-24 | Discharge: 2020-07-24 | Disposition: A | Discharge status: Home / Self Care | Payer: BC Managed Care – PPO | Source: Ambulatory Visit | Attending: Gastroenterology | Admitting: Gastroenterology

## 2020-07-24 DIAGNOSIS — R935 Abnormal findings on diagnostic imaging of other abdominal regions, including retroperitoneum: Secondary | ICD-10-CM

## 2020-07-24 DIAGNOSIS — K769 Liver disease, unspecified: Secondary | ICD-10-CM

## 2020-07-24 MED ORDER — GADOBENATE DIMEGLUMINE 529 MG/ML IV SOLN
16.0000 mL | Freq: Once | INTRAVENOUS | Status: AC | PRN
  Administered 2020-07-24: 16 mL via INTRAVENOUS

## 2020-12-21 ENCOUNTER — Other Ambulatory Visit: Payer: Self-pay | Admitting: Gastroenterology

## 2020-12-21 DIAGNOSIS — Z8719 Personal history of other diseases of the digestive system: Secondary | ICD-10-CM

## 2020-12-26 ENCOUNTER — Other Ambulatory Visit: Payer: BC Managed Care – PPO

## 2021-01-15 ENCOUNTER — Ambulatory Visit
Admission: RE | Admit: 2021-01-15 | Discharge: 2021-01-15 | Disposition: A | Discharge status: Home / Self Care | Payer: BC Managed Care – PPO | Source: Ambulatory Visit | Attending: Gastroenterology | Admitting: Gastroenterology

## 2021-01-15 ENCOUNTER — Other Ambulatory Visit: Payer: Self-pay

## 2021-01-15 DIAGNOSIS — Z8719 Personal history of other diseases of the digestive system: Secondary | ICD-10-CM

## 2021-01-15 MED ORDER — IOPAMIDOL (ISOVUE-300) INJECTION 61%
100.0000 mL | Freq: Once | INTRAVENOUS | Status: AC | PRN
  Administered 2021-01-15: 100 mL via INTRAVENOUS

## 2022-01-13 ENCOUNTER — Other Ambulatory Visit: Payer: Self-pay | Admitting: Gastroenterology

## 2022-01-13 DIAGNOSIS — K50019 Crohn's disease of small intestine with unspecified complications: Secondary | ICD-10-CM

## 2022-01-21 ENCOUNTER — Ambulatory Visit
Admission: RE | Admit: 2022-01-21 | Discharge: 2022-01-21 | Disposition: A | Discharge status: Home / Self Care | Payer: BC Managed Care – PPO | Source: Ambulatory Visit | Attending: Gastroenterology | Admitting: Gastroenterology

## 2022-01-21 DIAGNOSIS — K50019 Crohn's disease of small intestine with unspecified complications: Secondary | ICD-10-CM

## 2022-01-21 MED ORDER — IOPAMIDOL (ISOVUE-300) INJECTION 61%
100.0000 mL | Freq: Once | INTRAVENOUS | Status: AC | PRN
  Administered 2022-01-21: 100 mL via INTRAVENOUS

## 2022-02-20 IMAGING — CT CT ABD-PELV W/O CM
2 of 4 series · 15 of 46 positions shown, 17 images · non-contrast
Comparison: CT the abdomen and pelvis 12/30/2017.

CLINICAL DATA: 54-year-old female with history of generalized
abdominal pain radiating into the back for the past 12 hours with
associated nausea. History of bowel obstruction and Crohn's disease.

EXAM:
CT ABDOMEN AND PELVIS WITHOUT CONTRAST
TECHNIQUE: Multidetector CT imaging of the abdomen and pelvis was performed
following the standard protocol without IV contrast.

[Series 2: axial st · axial · 0.70mm/px · z∈[-203,+192]mm · 12 of 91 slices shown, 14 images]
[im 6/91  soft-tissue]
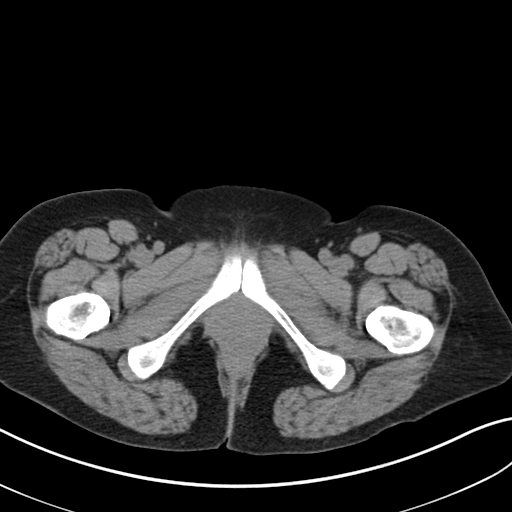
[im 6/91  bone]
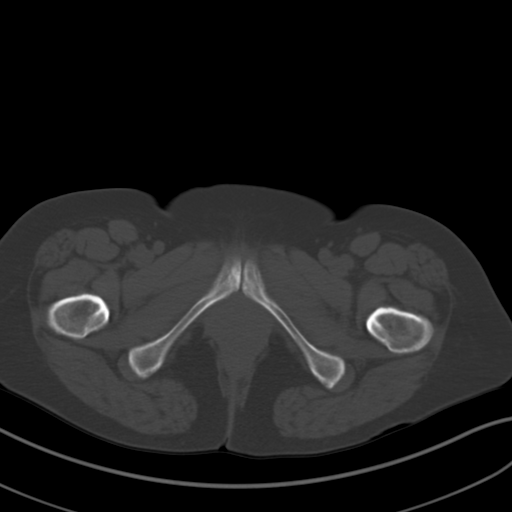
[im 16/91  soft-tissue]
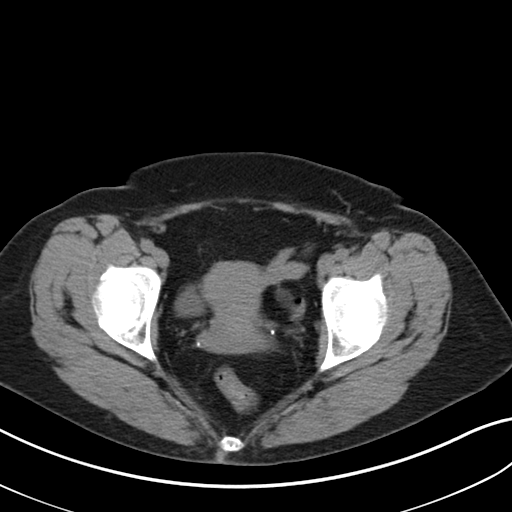
[im 22/91  soft-tissue]
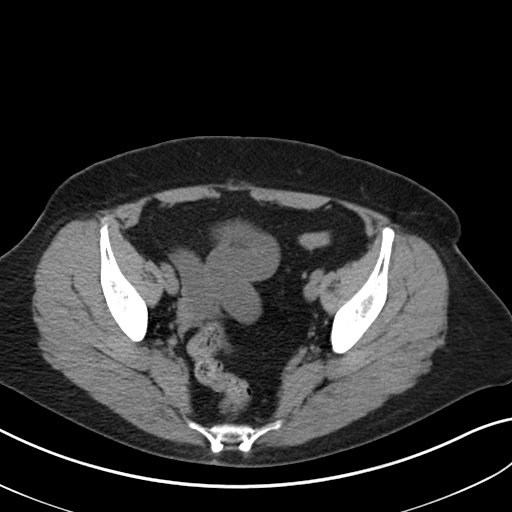
[im 27/91  soft-tissue]
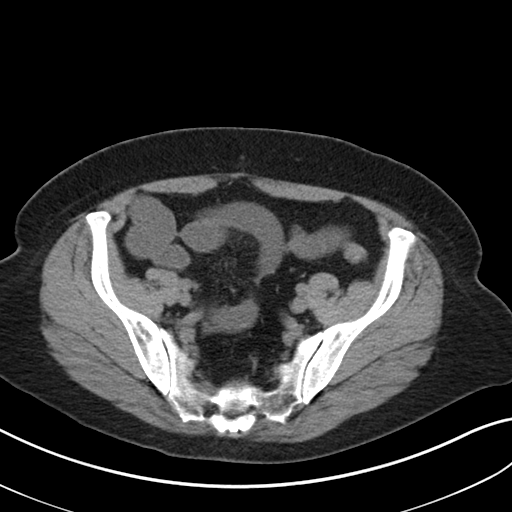
[im 38/91  soft-tissue]
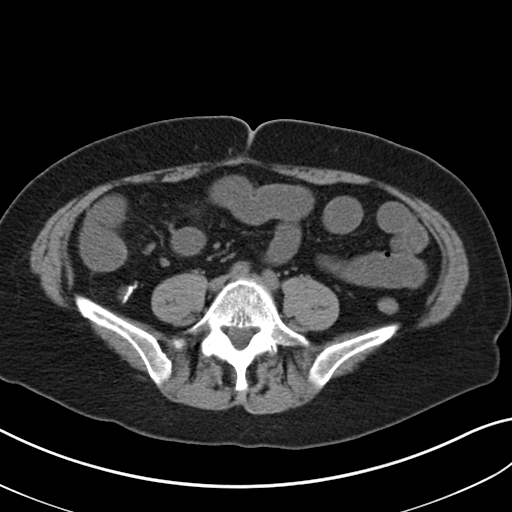
[im 43/91  soft-tissue]
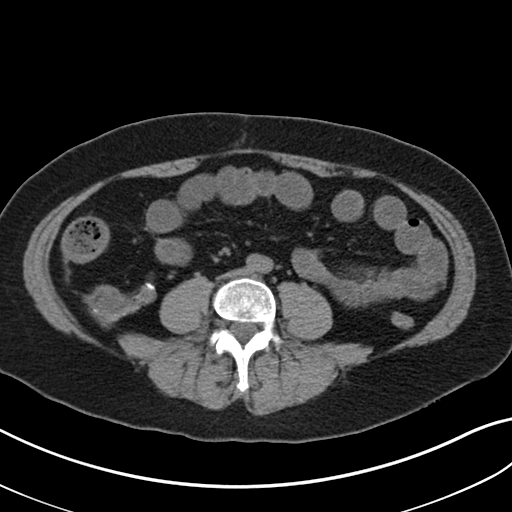
[im 48/91  soft-tissue]
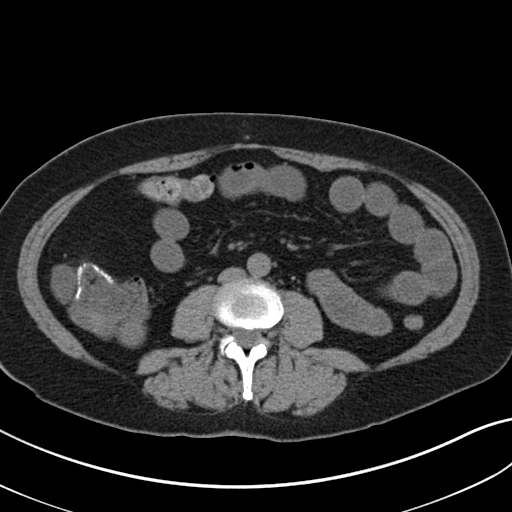
[im 59/91  soft-tissue]
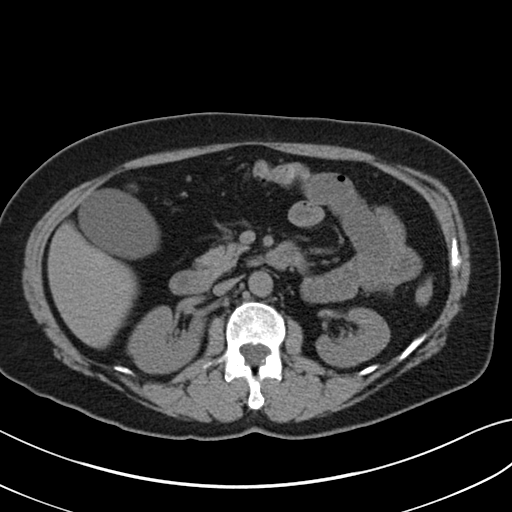
[im 64/91  soft-tissue]
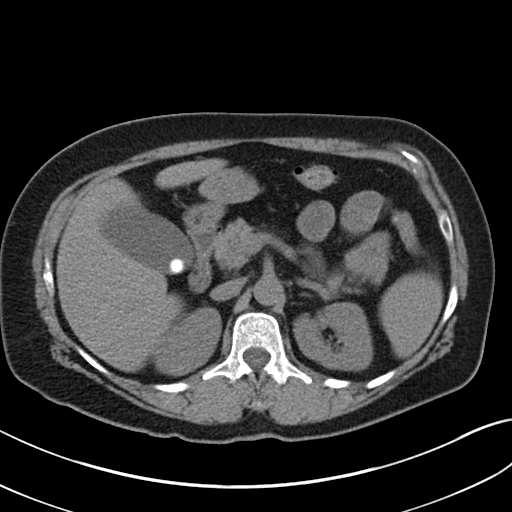
[im 64/91  bone]
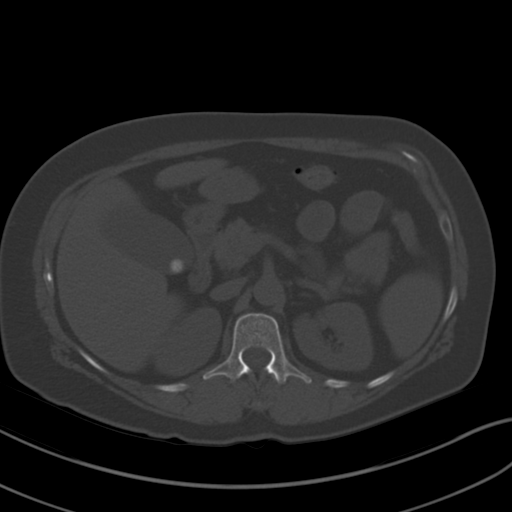
[im 69/91  soft-tissue]
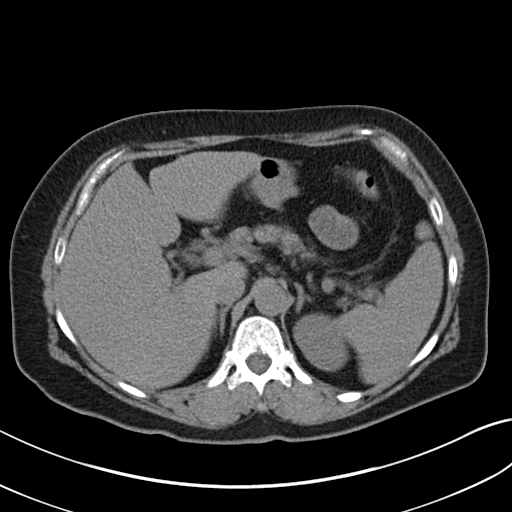
[im 80/91  soft-tissue]
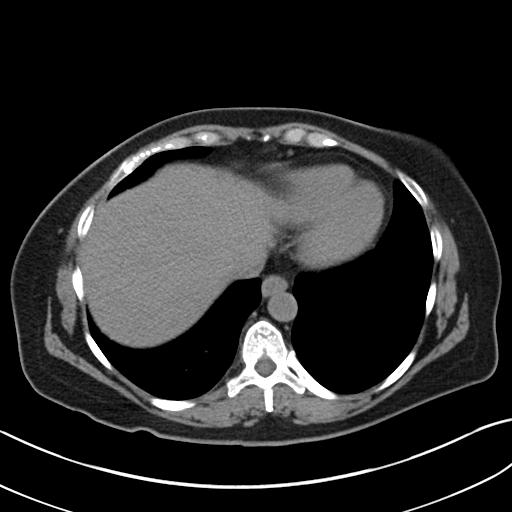
[im 85/91  soft-tissue]
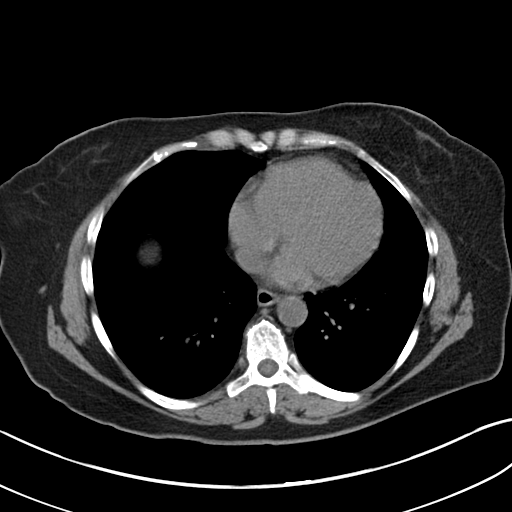

[Series 5: coronal st · coronal · 0.82mm/px · 3 of 135 slices shown]
[im 45/135  soft-tissue]
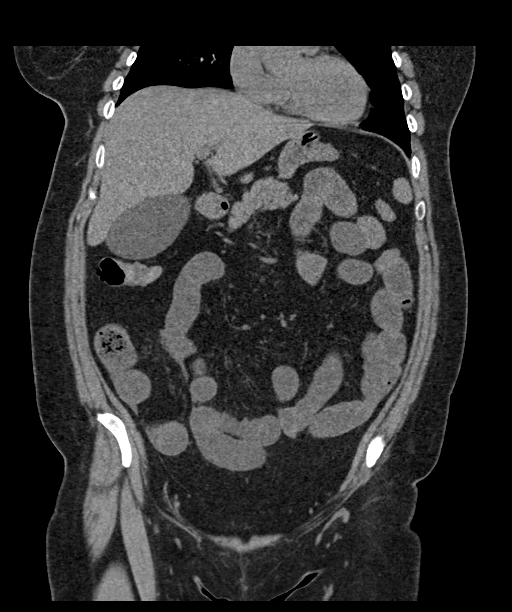
[im 60/135  soft-tissue]
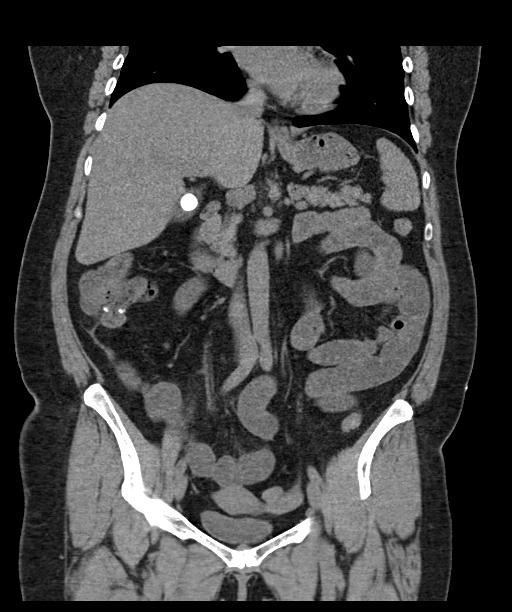
[im 75/135  soft-tissue]
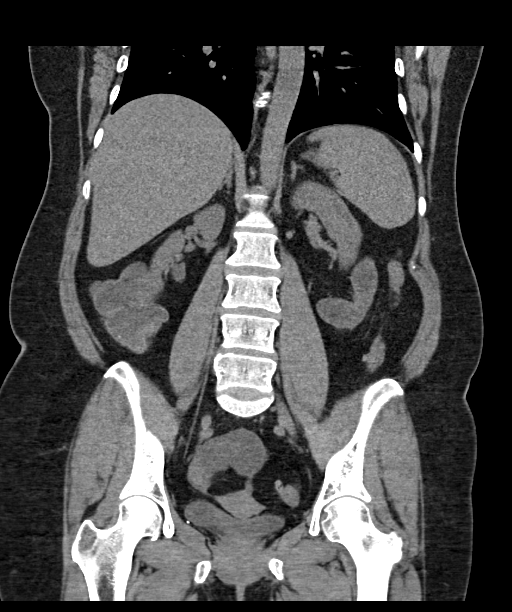

[15 of 46 positions shown; findings below may reference images not displayed]

FINDINGS: Lower chest: 2 mm nodule in the periphery of the right lower lobe
(axial image 6 of series 4), stable compared to prior study from
[id number], considered definitively benign requiring no future imaging
follow-up.

Hepatobiliary: Ill-defined low-attenuation lesion in segment 4A of
the liver (axial image 14 of series 2) incompletely characterized on
today's non-contrast CT examination, but new compared to prior CT
12/30/2017. Calcified gallstone lying dependently in the gallbladder
measuring 1.5 cm in diameter. Gallbladder is moderately distended,
but there is no pericholecystic fluid or surrounding inflammatory
changes to indicate an acute diverticulitis at this time.

Pancreas: No definite pancreatic mass or peripancreatic fluid
collections or inflammatory changes are noted on today's noncontrast
CT examination.

Spleen: Unremarkable.

Adrenals/Urinary Tract: There are no abnormal calcifications within
the collecting system of either kidney, along the course of either
ureter, or within the lumen of the urinary bladder. No
hydroureteronephrosis or perinephric stranding to suggest urinary
tract obstruction at this time. The unenhanced appearance of the
kidneys is unremarkable bilaterally. Unenhanced appearance of the
urinary bladder is normal. Bilateral adrenal glands are normal in
appearance.

Stomach/Bowel: Unenhanced appearance of the stomach is normal. No
pathologic dilatation of small bowel or colon. A few scattered
colonic diverticulae are noted, without surrounding inflammatory
changes to indicate an acute diverticulitis at this time.
Postoperative changes are noted in the distal small bowel,
suggesting prior terminal ileum resection with what appears to be
some mild thickening of the neo terminal ileum (poorly evaluated on
today's noncontrast CT examination) best appreciated on axial image
46 of series 2. Minimal inflammatory changes are noted adjacent to
the neo terminal ileum. Mild diffuse prominence of small-bowel loops
without frank dilatation or air-fluid levels to indicate
obstruction. Appendix has been resected.

Vascular/Lymphatic: No atherosclerotic calcifications in the
abdominal aorta or pelvic vasculature.

Reproductive: Unenhanced appearance of the uterus and ovaries is
unremarkable.

Other: No significant volume of ascites.  No pneumoperitoneum.

Musculoskeletal: There are no aggressive appearing lytic or blastic
lesions noted in the visualized portions of the skeleton.
IMPRESSION: 1. Mural thickening and mild inflammatory changes associated with
the neo terminal ileum, which could suggest recurrent active Crohn's
disease. At this time, this is not associated with frank bowel
obstruction.
2. New but indeterminate intermediate attenuation lesion in segment
4A of the liver. This may simply represent some focal fatty
infiltration, however, further characterization with nonemergent
outpatient abdominal MRI with and without IV gadolinium is
recommended in the near future to better characterize this finding.
3. Cholelithiasis without evidence of acute cholecystitis.
4. Additional incidental findings, as above.

## 2022-07-11 DIAGNOSIS — K760 Fatty (change of) liver, not elsewhere classified: Secondary | ICD-10-CM | POA: Insufficient documentation

## 2022-08-30 IMAGING — CT CT ENTEROGRAPHY (ABD-PELV W/ CM)
1 of 5 series · 13 of 32 positions shown, 18 images · IV contrast (APPLIED)
Comparison: 07/08/2020

CLINICAL DATA: History of Crohn's, status post terminal
ileocolectomy

EXAM:
CT ABDOMEN AND PELVIS WITH CONTRAST (ENTEROGRAPHY)
TECHNIQUE: Multidetector CT of the abdomen and pelvis during bolus
administration of intravenous contrast. Negative oral contrast was
given.
CONTRAST:  100mL [id number] IOPAMIDOL ([id number]-WXX) INJECTION 61%,
negative oral enteric contrast

[Series 5: thins · axial · 0.75mm/px · z∈[-422,-10]mm · 13 of 472 slices shown, 18 images]
[im 30/472  soft-tissue]
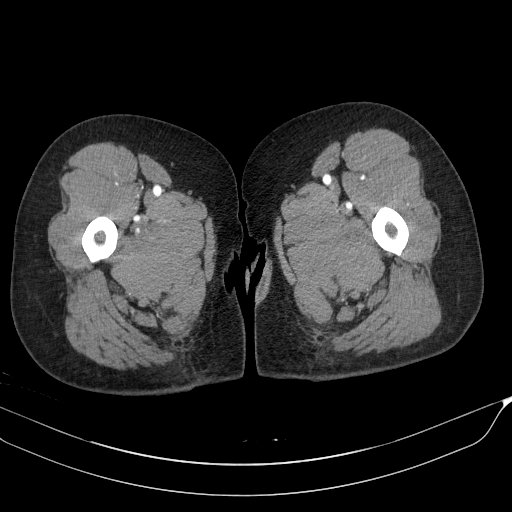
[im 30/472  bone]
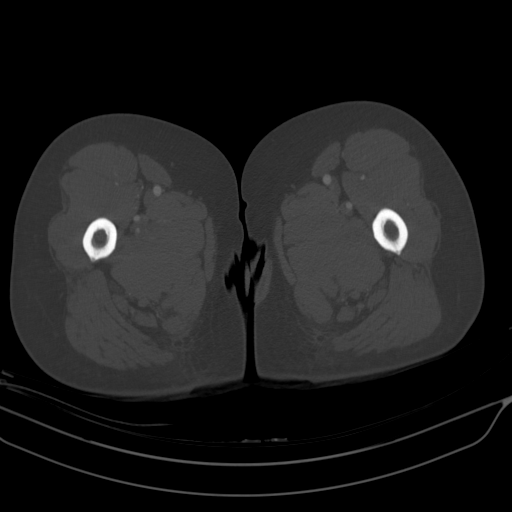
[im 59/472  soft-tissue]
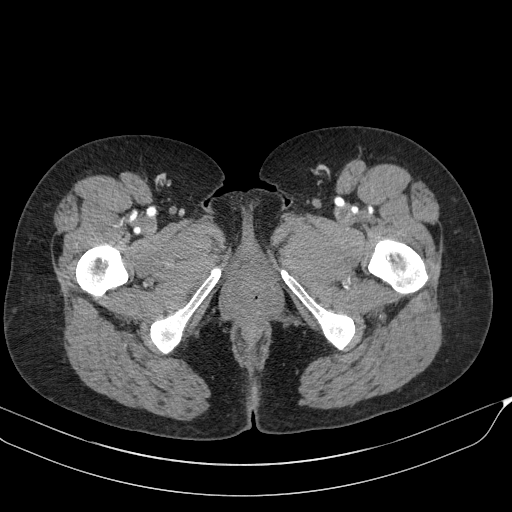
[im 118/472  soft-tissue]
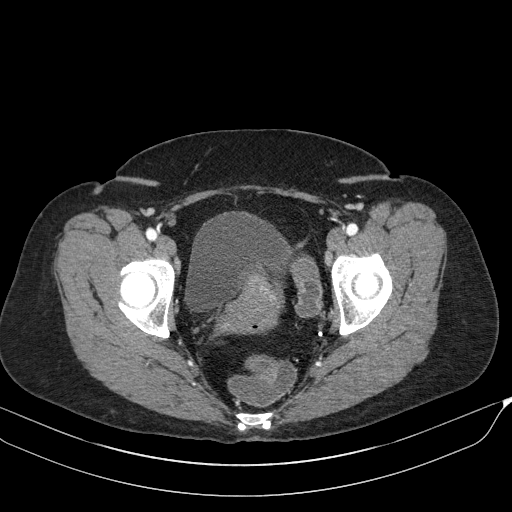
[im 148/472  soft-tissue]
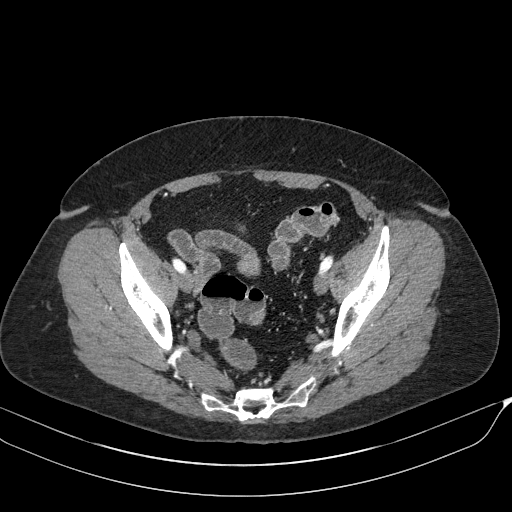
[im 177/472  soft-tissue]
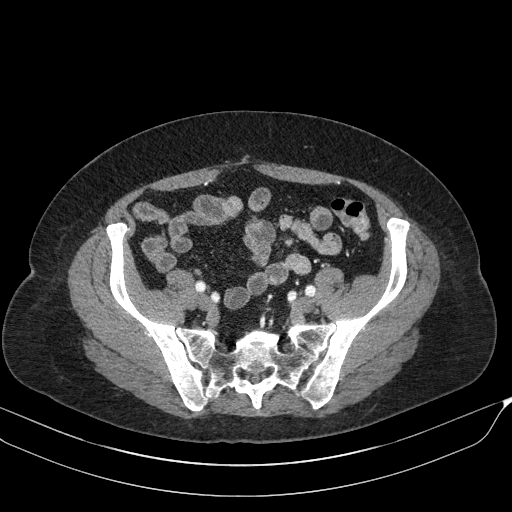
[im 207/472  soft-tissue]
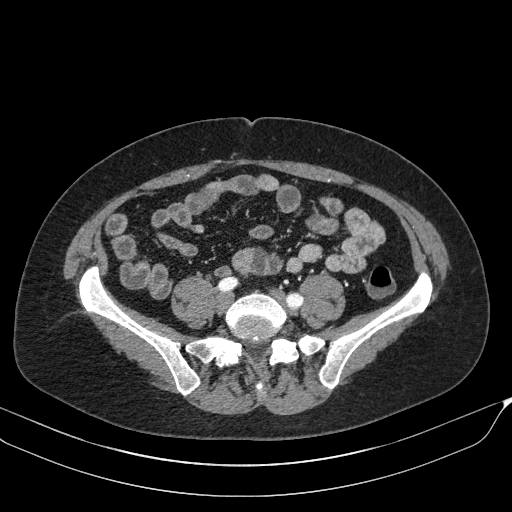
[im 265/472  soft-tissue]
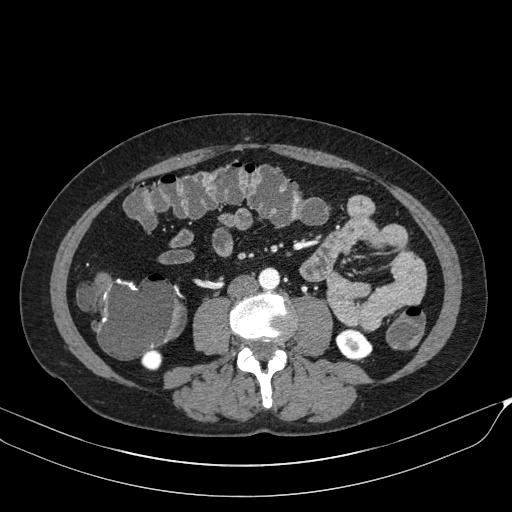
[im 295/472  soft-tissue]
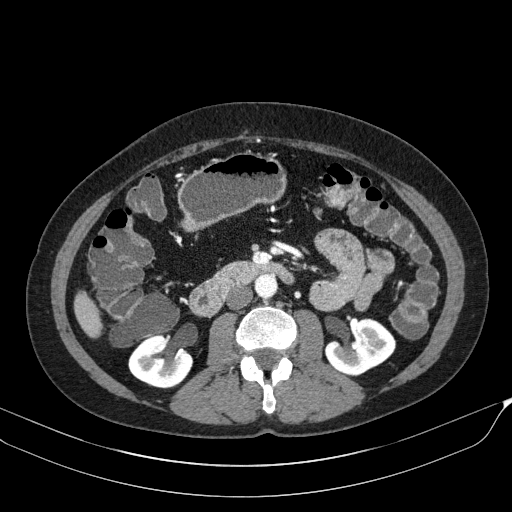
[im 324/472  soft-tissue]
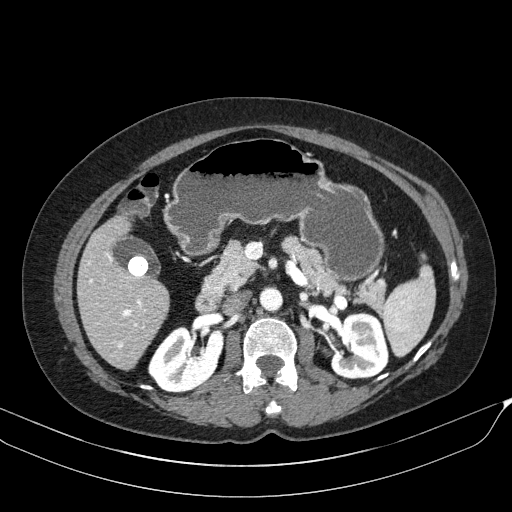
[im 324/472  bone]
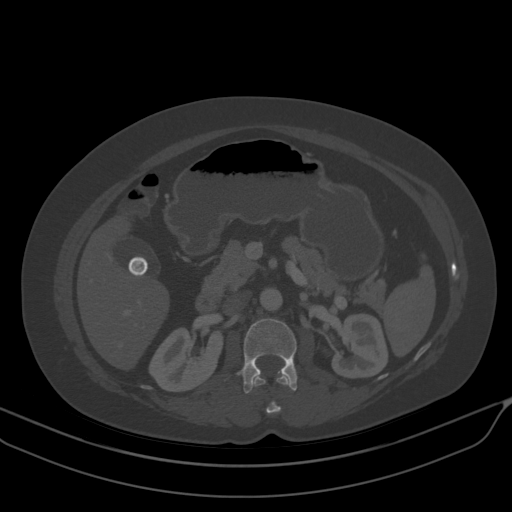
[im 354/472  soft-tissue]
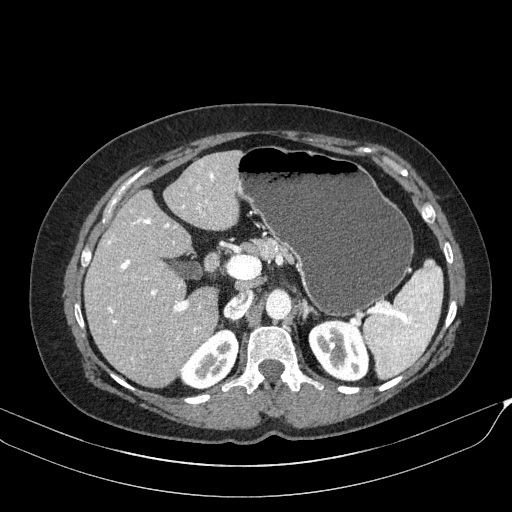
[im 354/472  lung]
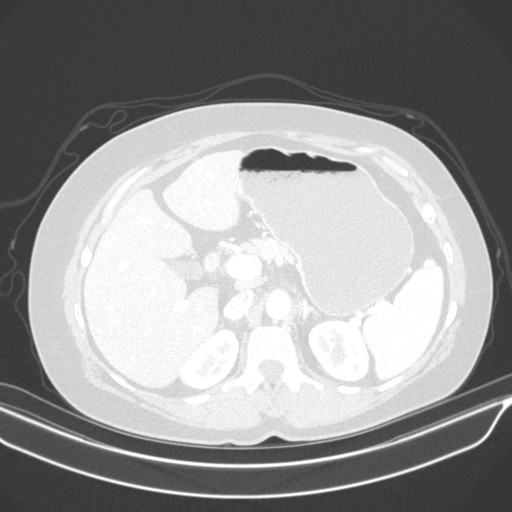
[im 383/472  lung]
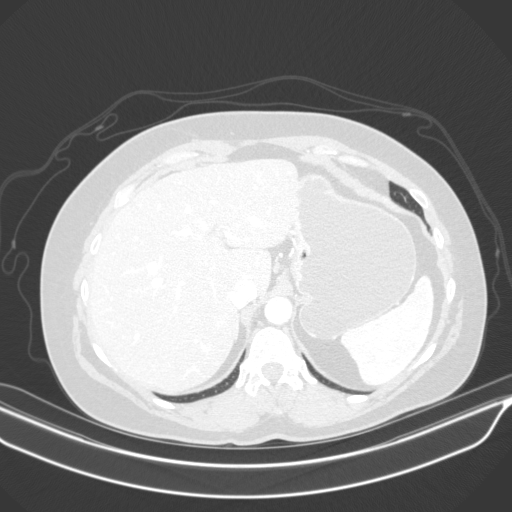
[im 413/472  soft-tissue]
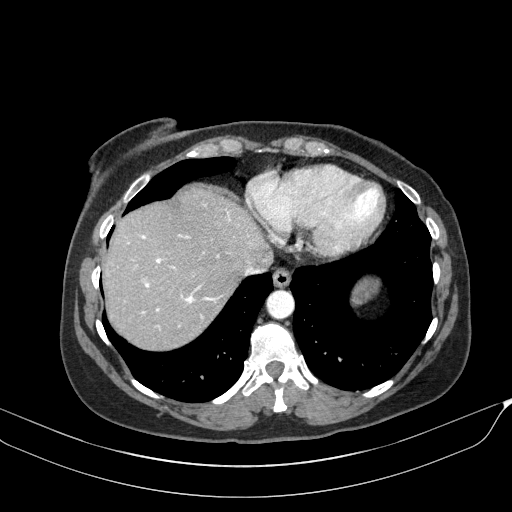
[im 413/472  lung]
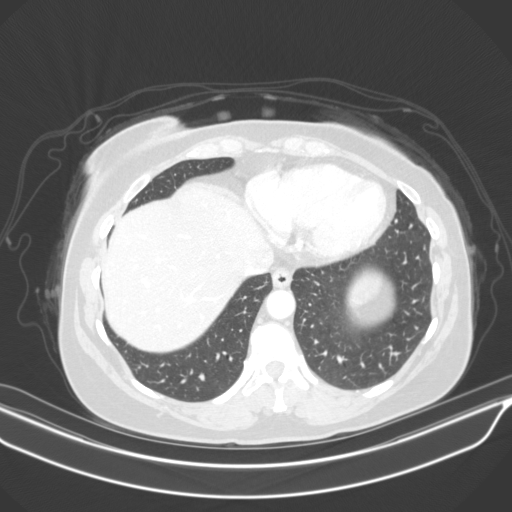
[im 442/472  soft-tissue]
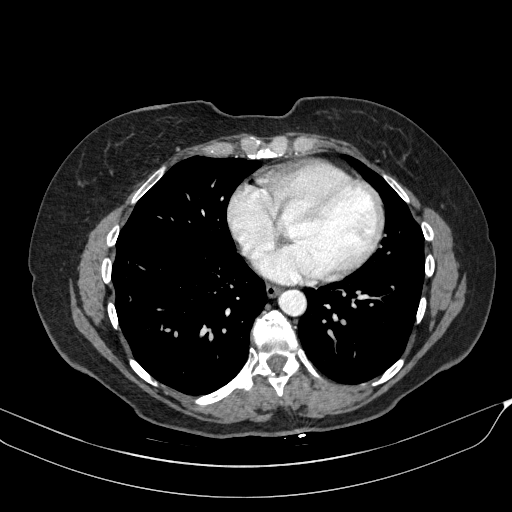
[im 442/472  lung]
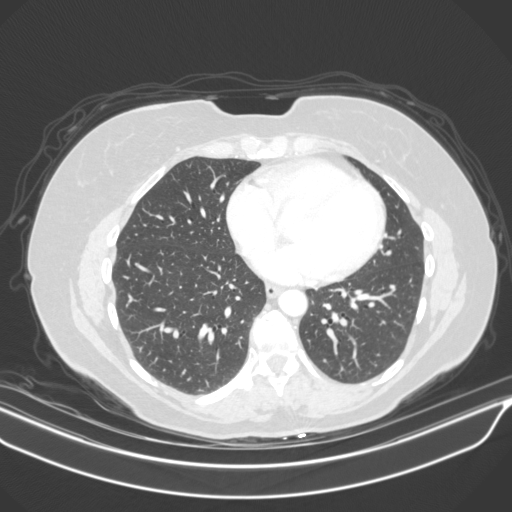

[13 of 32 positions shown; findings below may reference images not displayed]

FINDINGS: Lower chest: No acute abnormality.

Hepatobiliary: No solid liver abnormality is seen. Hepatic
steatosis. Gallstone in the gallbladder. No gallbladder wall
thickening, or biliary dilatation.

Pancreas: Unremarkable. No pancreatic ductal dilatation or
surrounding inflammatory changes.

Spleen: Normal in size without significant abnormality.

Adrenals/Urinary Tract: Adrenal glands are unremarkable. Kidneys are
normal, without renal calculi, solid lesion, or hydronephrosis.
Bladder is unremarkable.

Stomach/Bowel: Stomach is within normal limits. Status post terminal
ileocolectomy and ileocolic reanastomosis. There is a short segment
of narrowed, contrast hyperenhancing terminal ileum at the ileocecal
anastomosis, segment approximately 3 cm in length (series 3, image
109). There is no evidence of bowel obstruction, and the colon is
fluid-filled to the rectum.

Vascular/Lymphatic: No significant vascular findings are present. No
enlarged abdominal or pelvic lymph nodes.

Reproductive: No mass or other significant abnormality.

Other: No abdominal wall hernia or abnormality. No abdominopelvic
ascites.

Musculoskeletal: No acute or significant osseous findings.
IMPRESSION: 1. Status post terminal ileocolectomy and reanastomosis.
2. There is a short segment of narrowed, contrast hyperenhancing
terminal ileum at the ileocecal anastomosis, segment approximately 3
cm in length and similar to prior examination. There is no evidence
of overt stricturing or bowel obstruction at this time. Findings are
consistent with Crohn's disease with evidence of active
inflammation.
3. Hepatic steatosis.
4. Cholelithiasis.

## 2022-11-04 NOTE — Progress Notes (Unsigned)
[facility] [address] [place] [address] Phone: [phone] Subjective:   Shelley Sutton, am serving as a scribe for Dr. Antoine Primas.  I'm seeing this patient by the request  of:  Weber, [name], PA-C  CC: Multiple complaints  [id number]  Shelley Sutton is a 57 y.o. female coming in with complaint of L shoulder, arm, and ankle. Patient was caring for her mom a year ago when she developed shoulder pain. Pain in L shoulder starts in cervical spine and will radiate into the L upper arm. Sometimes feels unable to lift arm overhead. Denies any numbness or tingling.   Also c/o pain in L tib anterior. Also notes clicking in L knee with walking intermittently. Also when seated for a while she will have pain in L glute.       Past Medical History:  Diagnosis Date   Abdominal pain    ADD (attention deficit disorder)    Crohn's disease (HCC)    Depression    Depression    Esophageal reflux    Fatigue    FH: ovarian cancer    Fluttering sensation of heart    Gallstones    Gallstones    Hypertriglyceridemia    Onychomycosis    Onychomycosis    Palpitations    Past Surgical History:  Procedure Laterality Date   COLON RESECTION     COLONOSCOPY  09/1995, 06/1998,04/2002   ESOPHAGOGASTRODUODENOSCOPY  07/1995   PARTIAL COLECTOMY  2000   TONSILLECTOMY  1979   Social History   Socioeconomic History   Marital status: Married    Spouse name: Not on file   Number of children: 4   Years of education: Not on file   Highest education level: Not on file  Occupational History   Occupation: EMPLOYED  Tobacco Use   Smoking status: Former    Current packs/day: 0.00    Types: Cigarettes    Quit date: 12/03/1996    Years since quitting: 25.9   Smokeless tobacco: Never  Vaping Use   Vaping status: Not on file  Substance and Sexual Activity   Alcohol use: Yes    Alcohol/week: 0.0 standard drinks of alcohol   Drug use: No   Sexual activity:  Not on file  Other Topics Concern   Not on file  Social History Narrative   Not on file   Social Determinants of Health   Financial Resource Strain: Low Risk  (03/21/2022)   Received from [facility]   Overall Financial Resource Strain (CARDIA)    Difficulty of Paying Living Expenses: Not hard at all  Food Insecurity: No Food Insecurity (03/21/2022)   Received from [facility]   Hunger Vital Sign    Worried About Running Out of Food in the Last Year: Never true    Ran Out of Food in the Last Year: Never true  Transportation Needs: No Transportation Needs (03/21/2022)   Received from [facility] - Transportation    Lack of Transportation (Medical): No    Lack of Transportation (Non-Medical): No  Physical Activity: Insufficiently Active (03/21/2022)   Received from [facility]   Exercise Vital Sign    Days of Exercise per Week: 3 days    Minutes of Exercise per Session: 40 min  Stress: No Stress Concern Present (03/21/2022)   Received from [facility] - Occupational Stress Questionnaire    Feeling of Stress : Not  at all  Social Connections: Socially Integrated (03/21/2022)   Received from [facility]   Social Network    How would you rate your social network (family, work, friends)?: Good participation with social networks   Allergies  Allergen Reactions   Dicyclomine Hcl Nausea And Vomiting   Family History  Problem Relation Age of Onset   Ovarian cancer Maternal Grandmother    Heart failure Maternal Grandfather    Cancer Paternal Grandmother    Brain cancer Paternal Grandfather       Current Outpatient Medications (Respiratory):    oxymetazoline (AFRIN) 0.05 % nasal spray, Place 1 spray into both nostrils 2 (two) times daily as needed for congestion.    Current Outpatient Medications (Other):    Ascorbic Acid (VITAMIN C PO), Take 1 tablet by mouth daily.   B Complex Vitamins (VITAMIN B-COMPLEX PO), Take 1  tablet by mouth daily.   Multiple Vitamin (MULTIVITAMIN WITH MINERALS) TABS tablet, Take 1 tablet by mouth daily.   terbinafine (LAMISIL) 250 MG tablet, Take 1 tablet (250 mg total) by mouth daily.   Reviewed prior external information including notes and imaging from  primary care provider As well as notes that were available from care everywhere and other healthcare systems.  Past medical history, social, surgical and family history all reviewed in electronic medical record.  No pertanent information unless stated regarding to the chief complaint.   Review of Systems:  No headache, visual changes, nausea, vomiting, diarrhea, constipation, dizziness, abdominal pain, skin rash, fevers, chills, night sweats, weight loss, swollen lymph nodes, body aches, joint swelling, chest pain, shortness of breath, mood changes. POSITIVE muscle aches  Objective  Blood pressure 108/76, height 5\' 1"  (1.549 m), weight 157 lb (71.2 kg), last menstrual period 05/08/2012, SpO2 98%.   General: No apparent distress alert and oriented x3 mood and affect normal, dressed appropriately.  HEENT: Pupils equal, extraocular movements intact  Respiratory: Patient's speak in full sentences and does not appear short of breath  Cardiovascular: No lower extremity edema, non tender, no erythema  Shoulder exam shows patient does have weakness noted with 2 out of 5 strength of the rotator cuff on the left side.  Difficulty with even raising against gravity.  Positive impingement noted with Neer and Hawkins.  Voluntary guarding noted  Left piriformis does have a tenderness noted.  Positive FABER test on the left side.  Negative straight leg test.  Limited muscular skeletal ultrasound was performed and interpreted by Antoine Primas, M  Left shoulder exam shows the patient does have hypoechoic changes in the shoulder itself.  Patient does have what appears to be a partial near full-thickness tear of the supraspinatus with some  retraction noted up to nearly 1 cm.  Significant dilatation of the tendon as well consistent with chronic tendinitis.  Mild thickening of the anterior and lateral capsule noted.    Impression and Recommendations:     The above documentation has been reviewed and is accurate and complete Judi Saa, DO

## 2022-11-05 ENCOUNTER — Ambulatory Visit (INDEPENDENT_AMBULATORY_CARE_PROVIDER_SITE_OTHER): Payer: BC Managed Care – PPO

## 2022-11-05 ENCOUNTER — Ambulatory Visit (INDEPENDENT_AMBULATORY_CARE_PROVIDER_SITE_OTHER): Payer: BC Managed Care – PPO | Admitting: Family Medicine

## 2022-11-05 ENCOUNTER — Encounter: Payer: Self-pay | Admitting: Family Medicine

## 2022-11-05 ENCOUNTER — Other Ambulatory Visit: Payer: Self-pay

## 2022-11-05 VITALS — BP 108/76 | Ht 61.0 in | Wt 157.0 lb

## 2022-11-05 DIAGNOSIS — M75112 Incomplete rotator cuff tear or rupture of left shoulder, not specified as traumatic: Secondary | ICD-10-CM

## 2022-11-05 DIAGNOSIS — M25512 Pain in left shoulder: Secondary | ICD-10-CM

## 2022-11-05 DIAGNOSIS — M75102 Unspecified rotator cuff tear or rupture of left shoulder, not specified as traumatic: Secondary | ICD-10-CM | POA: Insufficient documentation

## 2022-11-05 DIAGNOSIS — G5702 Lesion of sciatic nerve, left lower limb: Secondary | ICD-10-CM | POA: Insufficient documentation

## 2022-11-05 NOTE — Patient Instructions (Signed)
Xray L shoulder MRI L shoulder Piriformis exercises HOKA or OOFOS recovery Spenco total support original orthotics Will write to you when we get MRI

## 2022-11-05 NOTE — Assessment & Plan Note (Signed)
Patient has been dealing with left shoulder pain for quite some time and sounds. Patient does have what appears to be a either severe tendinitis but I do think that there is unfortunately a tear with retraction noted on ultrasound.  Need to confirm it with MRI.  This is affecting daily activities including range of motion as well as things like dressing and waking her up at night.  Failed other things such as injection from outside facility as well as anti-inflammatories.  Patient has not been able to increase activity secondary to the discomfort.  Will get MRI to further evaluate and then discuss management.

## 2022-11-05 NOTE — Assessment & Plan Note (Signed)
Using [employer]'s Orthopaedic Anatomy, reviewed with the patient the structures involved and how they related to diagnosis. The patient indicated understanding.   The patient was given a handout about classic piriformis stretching including [facility] [facility], Modified [employer], my self-described "Sink Stretch," and other piriformis rehab.  We also reviewed hip flexor and abductor strengthening, ham stretching  Rec deep massage, explained self-massage with ball Return to clinic in 6 weeks

## 2022-11-27 ENCOUNTER — Other Ambulatory Visit: Payer: BC Managed Care – PPO

## 2022-11-29 ENCOUNTER — Ambulatory Visit
Admission: RE | Admit: 2022-11-29 | Discharge: 2022-11-29 | Disposition: A | Discharge status: Home / Self Care | Payer: BC Managed Care – PPO | Source: Ambulatory Visit | Attending: Family Medicine | Admitting: Family Medicine

## 2022-11-29 DIAGNOSIS — M25512 Pain in left shoulder: Secondary | ICD-10-CM

## 2022-12-17 ENCOUNTER — Encounter: Payer: Self-pay | Admitting: Family Medicine

## 2022-12-22 NOTE — Progress Notes (Unsigned)
[facility] [address] [place] [address] Phone: [phone] Subjective:   Shelley Sutton, am serving as a scribe for Dr. Antoine Primas.  I'm seeing this patient by the request  of:  Weber, [name], PA-C  CC: left shoulder   [id number]  11/05/2022 Using [facility] [facility], reviewed with the patient the structures involved and how they related to diagnosis. The patient indicated understanding.    The patient was given a handout about classic piriformis stretching including [facility], [facility], my self-described "Sink Stretch," and other piriformis rehab.   We also reviewed hip flexor and abductor strengthening, ham stretching   Rec deep massage, explained self-massage with ball Return to clinic in 6 weeks  Patient has been dealing with left shoulder pain for quite some time and sounds. Patient does have what appears to be a either severe tendinitis but I do think that there is unfortunately a tear with retraction noted on ultrasound.  Need to confirm it with MRI.  This is affecting daily activities including range of motion as well as things like dressing and waking her up at night.  Failed other things such as injection from outside facility as well as anti-inflammatories.  Patient has not been able to increase activity secondary to the discomfort.  Will get MRI to further evaluate and then discuss management.  Updated 12/23/2022 Shelley Sutton is a 57 y.o. female coming in with complaint of L shoulder pain. Here for PRP. Patient states that her shoulder is able the same as last visit.   MRI of the shoulder did show severe tendinosis of the supraspinatus with a partial-thickness articular surface tear noted.     Past Medical History:  Diagnosis Date   Abdominal pain    ADD (attention deficit disorder)    Crohn's disease (HCC)    Depression    Depression    Esophageal reflux    Fatigue    FH: ovarian cancer     Fluttering sensation of heart    Gallstones    Gallstones    Hypertriglyceridemia    Onychomycosis    Onychomycosis    Palpitations    Past Surgical History:  Procedure Laterality Date   COLON RESECTION     COLONOSCOPY  09/1995, 06/1998,04/2002   ESOPHAGOGASTRODUODENOSCOPY  07/1995   PARTIAL COLECTOMY  2000   TONSILLECTOMY  1979   Social History   Socioeconomic History   Marital status: Married    Spouse name: Not on file   Number of children: 4   Years of education: Not on file   Highest education level: Not on file  Occupational History   Occupation: EMPLOYED  Tobacco Use   Smoking status: Former    Current packs/day: 0.00    Types: Cigarettes    Quit date: 12/03/1996    Years since quitting: 26.0   Smokeless tobacco: Never  Vaping Use   Vaping status: Not on file  Substance and Sexual Activity   Alcohol use: Yes    Alcohol/week: 0.0 standard drinks of alcohol   Drug use: No   Sexual activity: Not on file  Other Topics Concern   Not on file  Social History Narrative   Not on file   Social Determinants of Health   Financial Resource Strain: Low Risk  (03/21/2022)   Received from [facility]   Overall Financial Resource Strain (CARDIA)    Difficulty of Paying Living Expenses: Not hard at all  Food Insecurity:  No Food Insecurity (03/21/2022)   Received from [facility] Vital Sign    Worried About Running Out of Food in the Last Year: Never true    Ran Out of Food in the Last Year: Never true  Transportation Needs: No Transportation Needs (03/21/2022)   Received from [facility] - Transportation    Lack of Transportation (Medical): No    Lack of Transportation (Non-Medical): No  Physical Activity: Insufficiently Active (03/21/2022)   Received from [facility]   Exercise Vital Sign    Days of Exercise per Week: 3 days    Minutes of Exercise per Session: 40 min  Stress: No Stress Concern Present (03/21/2022)   Received from [facility] of Occupational Health - Occupational Stress Questionnaire    Feeling of Stress : Not at all  Social Connections: Socially Integrated (03/21/2022)   Received from [facility]   Social Network    How would you rate your social network (family, work, friends)?: Good participation with social networks   Allergies  Allergen Reactions   Dicyclomine Hcl Nausea And Vomiting   Family History  Problem Relation Age of Onset   Ovarian cancer Maternal Grandmother    Heart failure Maternal Grandfather    Cancer Paternal Grandmother    Brain cancer Paternal Grandfather       Current Outpatient Medications (Respiratory):    oxymetazoline (AFRIN) 0.05 % nasal spray, Place 1 spray into both nostrils 2 (two) times daily as needed for congestion.    Current Outpatient Medications (Other):    Ascorbic Acid (VITAMIN C PO), Take 1 tablet by mouth daily.   B Complex Vitamins (VITAMIN B-COMPLEX PO), Take 1 tablet by mouth daily.   Multiple Vitamin (MULTIVITAMIN WITH MINERALS) TABS tablet, Take 1 tablet by mouth daily.   terbinafine (LAMISIL) 250 MG tablet, Take 1 tablet (250 mg total) by mouth daily.     Objective  Last menstrual period 05/08/2012.   General: No apparent distress alert and oriented x3 mood and affect normal, dressed appropriately.   Procedure: Real-time Ultrasound Guided Injection of left glenohumeral joint Device: GE Logiq E  Ultrasound guided injection is preferred based studies that show increased duration, increased effect, greater accuracy, decreased procedural pain, increased response rate with ultrasound guided versus blind injection.  Verbal informed consent obtained.  Time-out conducted.  Noted no overlying erythema, induration, or other signs of local infection.  Skin prepped in a sterile fashion.  Local anesthesia: Topical Ethyl chloride.  With sterile technique and under real time ultrasound guidance:  Joint visualized.  21g 2 inch needle  inserted lateral approach. Pictures taken for needle placement. Patient did have injection of 2 cc of 0.5% Marcaine, then injected with 4 cc of PRP into the tendon sheath Completed without difficulty  Pain immediately resolved suggesting accurate placement of the medication.  Advised to call if fevers/chills, erythema, induration, drainage, or persistent bleeding.  Impression: Technically successful ultrasound guided injection.     Impression and Recommendations:     The above documentation has been reviewed and is accurate and complete Judi Saa, DO

## 2022-12-23 ENCOUNTER — Other Ambulatory Visit: Payer: Self-pay

## 2022-12-23 ENCOUNTER — Encounter: Payer: Self-pay | Admitting: Family Medicine

## 2022-12-23 ENCOUNTER — Ambulatory Visit (INDEPENDENT_AMBULATORY_CARE_PROVIDER_SITE_OTHER): Payer: Self-pay | Admitting: Family Medicine

## 2022-12-23 VITALS — BP 108/72 | HR 105 | Ht 61.0 in | Wt 160.0 lb

## 2022-12-23 DIAGNOSIS — M25512 Pain in left shoulder: Secondary | ICD-10-CM

## 2022-12-23 DIAGNOSIS — M75112 Incomplete rotator cuff tear or rupture of left shoulder, not specified as traumatic: Secondary | ICD-10-CM

## 2022-12-23 NOTE — Assessment & Plan Note (Signed)
Post PRP handout given and work with Event organiser.  Will follow-up as scheduled

## 2022-12-23 NOTE — Patient Instructions (Signed)
No ice or IBU for 3 days Heat and Tylenol are ok See me again in 6-8 weeks 

## 2023-02-12 NOTE — Progress Notes (Signed)
 [facility] [address] [place] [address] Phone: [phone] Subjective:   [name], am serving as a scribe for Dr. Arthea Claudene.  I'm seeing this patient by the request  of:  Weber, Lauraine CROME, PA-C  CC: left shoulder pain f/u  [id number]  12/23/2022 Post PRP handout given and work with event organiser.  Will follow-up as scheduled      Update 02/18/2023 Shelley Sutton is a 58 y.o. female coming in with complaint of L shoulder pain.  PRP on 11/12. Patient states she thinks everything is going well. No pain at all.      Past Medical History:  Diagnosis Date   Abdominal pain    ADD (attention deficit disorder)    Crohn's disease (HCC)    Depression    Depression    Esophageal reflux    Fatigue    FH: ovarian cancer    Fluttering sensation of heart    Gallstones    Gallstones    Hypertriglyceridemia    Onychomycosis    Onychomycosis    Palpitations    Past Surgical History:  Procedure Laterality Date   COLON RESECTION     COLONOSCOPY  09/1995, 06/1998,04/2002   ESOPHAGOGASTRODUODENOSCOPY  07/1995   PARTIAL COLECTOMY  2000   TONSILLECTOMY  1979   Social History   Socioeconomic History   Marital status: Married    Spouse name: Not on file   Number of children: 4   Years of education: Not on file   Highest education level: Not on file  Occupational History   Occupation: EMPLOYED  Tobacco Use   Smoking status: Former    Current packs/day: 0.00    Types: Cigarettes    Quit date: 12/03/1996    Years since quitting: 26.2   Smokeless tobacco: Never  Vaping Use   Vaping status: Not on file  Substance and Sexual Activity   Alcohol use: Yes    Alcohol/week: 0.0 standard drinks of alcohol   Drug use: No   Sexual activity: Not on file  Other Topics Concern   Not on file  Social History Narrative   Not on file   Social Drivers of Health   Financial Resource Strain: Low Risk  (03/21/2022)   Received from [facility]   Overall Financial Resource Strain (CARDIA)    Difficulty of Paying Living Expenses: Not hard at all  Food Insecurity: No Food Insecurity (03/21/2022)   Received from [facility]   Hunger Vital Sign    Worried About Running Out of Food in the Last Year: Never true    Ran Out of Food in the Last Year: Never true  Transportation Needs: No Transportation Needs (03/21/2022)   Received from [facility] - Transportation    Lack of Transportation (Medical): No    Lack of Transportation (Non-Medical): No  Physical Activity: Insufficiently Active (03/21/2022)   Received from [facility]   Exercise Vital Sign    Days of Exercise per Week: 3 days    Minutes of Exercise per Session: 40 min  Stress: No Stress Concern Present (03/21/2022)   Received from [facility] of Occupational Health - Occupational Stress Questionnaire    Feeling of Stress : Not at all  Social Connections: Socially Integrated (03/21/2022)   Received from [facility]   Social Network    How would you rate your social network (family, work, friends)?: Good participation with social networks  Allergies  Allergen Reactions   Dicyclomine Hcl Nausea And Vomiting   Family History  Problem Relation Age of Onset   Ovarian cancer Maternal Grandmother    Heart failure Maternal Grandfather    Cancer Paternal Grandmother    Brain cancer Paternal Grandfather       Current Outpatient Medications (Respiratory):    oxymetazoline  (AFRIN) 0.05 % nasal spray, Place 1 spray into both nostrils 2 (two) times daily as needed for congestion.    Current Outpatient Medications (Other):    Ascorbic Acid (VITAMIN C PO), Take 1 tablet by mouth daily.   B Complex Vitamins (VITAMIN B-COMPLEX PO), Take 1 tablet by mouth daily.   Multiple Vitamin (MULTIVITAMIN WITH MINERALS) TABS tablet, Take 1 tablet by mouth daily.   terbinafine  (LAMISIL ) 250 MG tablet, Take 1 tablet (250 mg total) by mouth  daily.   Reviewed prior external information including notes and imaging from  primary care provider As well as notes that were available from care everywhere and other healthcare systems.  Past medical history, social, surgical and family history all reviewed in electronic medical record.  No pertanent information unless stated regarding to the chief complaint.   Review of Systems:  No headache, visual changes, nausea, vomiting, diarrhea, constipation, dizziness, abdominal pain, skin rash, fevers, chills, night sweats, weight loss, swollen lymph nodes, body aches, joint swelling, chest pain, shortness of breath, mood changes. POSITIVE muscle aches  Objective  Blood pressure 110/80, pulse 91, height 5' 1 (1.549 m), last menstrual period 05/08/2012, SpO2 98%.   General: No apparent distress alert and oriented x3 mood and affect normal, dressed appropriately.  HEENT: Pupils equal, extraocular movements intact  Respiratory: Patient's speak in full sentences and does not appear short of breath  Cardiovascular: No lower extremity edema, non tender, no erythema  Left shoulder exam shows patient still has some mild weakness of the rotator cuff compared to the contralateral side but improvement in range of motion.  Neurovascularly intact distally.  Limited muscular skeletal ultrasound was performed and interpreted by CLAUDENE HUSSAR, M  Limited ultrasound shows some scar tissue formation noted.  Seems to be over the supraspinatus.  Approximately 80% of the initial tear seems to be healed at the. Impression: Interval improvement   Impression and Recommendations:    The above documentation has been reviewed and is accurate and complete Yvanna Vidas M Texie Tupou, DO

## 2023-02-18 ENCOUNTER — Encounter: Payer: Self-pay | Admitting: Family Medicine

## 2023-02-18 ENCOUNTER — Ambulatory Visit: Payer: BC Managed Care – PPO | Admitting: Family Medicine

## 2023-02-18 ENCOUNTER — Other Ambulatory Visit: Payer: Self-pay

## 2023-02-18 VITALS — BP 110/80 | HR 91 | Ht 61.0 in

## 2023-02-18 DIAGNOSIS — M25512 Pain in left shoulder: Secondary | ICD-10-CM

## 2023-02-18 DIAGNOSIS — M75112 Incomplete rotator cuff tear or rupture of left shoulder, not specified as traumatic: Secondary | ICD-10-CM | POA: Diagnosis not present

## 2023-02-18 NOTE — Patient Instructions (Addendum)
 Good to see you  Overall looks about 80% better  Lift weights at 50% Increase weight 10% weekly  Ice after activity Keep hands with in Periferal vision Follow up in 2-3 months

## 2023-02-18 NOTE — Assessment & Plan Note (Signed)
 Patient does look significantly better at this moment.  Is about 2 months out from the PRP injection and looks like patient has healed extremely well.  Still not fully healed and does still have some mild weakness noted.  Discussed with patient to start increasing some strength training.  Do believe that Shelley Sutton can do it on her own and patient is highly motivated.  Discussed increasing 50% and then increasing to 10% a week.  Follow-up with me again in 2 months

## 2023-04-20 ENCOUNTER — Ambulatory Visit: Payer: BC Managed Care – PPO | Admitting: Family Medicine
